# Patient Record
Sex: Male | Born: 1935
Health system: Southern US, Community
[De-identification: ages and names within clinical notes are randomized; demographics above are authoritative.]

## PROBLEM LIST (undated history)

## (undated) DIAGNOSIS — H9190 Unspecified hearing loss, unspecified ear: Secondary | ICD-10-CM

## (undated) DIAGNOSIS — I10 Essential (primary) hypertension: Secondary | ICD-10-CM

## (undated) DIAGNOSIS — E119 Type 2 diabetes mellitus without complications: Secondary | ICD-10-CM

## (undated) DIAGNOSIS — F0390 Unspecified dementia without behavioral disturbance: Secondary | ICD-10-CM

## (undated) DIAGNOSIS — Z9289 Personal history of other medical treatment: Secondary | ICD-10-CM

## (undated) DIAGNOSIS — E785 Hyperlipidemia, unspecified: Secondary | ICD-10-CM

## (undated) HISTORY — DX: Unspecified dementia, unspecified severity, without behavioral disturbance, psychotic disturbance, mood disturbance, and anxiety: F03.90

## (undated) HISTORY — DX: Personal history of other medical treatment: Z92.89

## (undated) HISTORY — DX: Hyperlipidemia, unspecified: E78.5

## (undated) HISTORY — DX: Essential (primary) hypertension: I10

## (undated) HISTORY — DX: Unspecified hearing loss, unspecified ear: H91.90

## (undated) HISTORY — DX: Type 2 diabetes mellitus without complications: E11.9

---

## 1993-07-07 HISTORY — PX: BACK SURGERY: SHX140

## 2002-01-03 ENCOUNTER — Encounter: Payer: Self-pay | Admitting: Neurosurgery

## 2002-01-03 ENCOUNTER — Encounter: Admission: RE | Admit: 2002-01-03 | Discharge: 2002-01-03 | Payer: Self-pay | Admitting: Neurosurgery

## 2002-01-17 ENCOUNTER — Encounter: Payer: Self-pay | Admitting: Neurosurgery

## 2002-01-17 ENCOUNTER — Encounter: Admission: RE | Admit: 2002-01-17 | Discharge: 2002-01-17 | Payer: Self-pay | Admitting: Neurosurgery

## 2003-12-06 ENCOUNTER — Ambulatory Visit (HOSPITAL_COMMUNITY): Admission: RE | Admit: 2003-12-06 | Discharge: 2003-12-06 | Payer: Self-pay | Admitting: *Deleted

## 2006-05-07 DIAGNOSIS — Z9289 Personal history of other medical treatment: Secondary | ICD-10-CM

## 2006-05-07 HISTORY — DX: Personal history of other medical treatment: Z92.89

## 2006-06-17 ENCOUNTER — Ambulatory Visit (HOSPITAL_COMMUNITY): Admission: RE | Admit: 2006-06-17 | Discharge: 2006-06-18 | Payer: Self-pay | Admitting: Specialist

## 2010-09-05 DIAGNOSIS — Z9289 Personal history of other medical treatment: Secondary | ICD-10-CM

## 2010-09-05 HISTORY — DX: Personal history of other medical treatment: Z92.89

## 2014-11-01 DIAGNOSIS — E118 Type 2 diabetes mellitus with unspecified complications: Secondary | ICD-10-CM | POA: Diagnosis not present

## 2014-11-01 DIAGNOSIS — I10 Essential (primary) hypertension: Secondary | ICD-10-CM | POA: Diagnosis not present

## 2014-11-01 DIAGNOSIS — E7889 Other lipoprotein metabolism disorders: Secondary | ICD-10-CM | POA: Diagnosis not present

## 2014-11-01 DIAGNOSIS — E119 Type 2 diabetes mellitus without complications: Secondary | ICD-10-CM | POA: Diagnosis not present

## 2014-11-08 DIAGNOSIS — I10 Essential (primary) hypertension: Secondary | ICD-10-CM | POA: Diagnosis not present

## 2014-11-08 DIAGNOSIS — E789 Disorder of lipoprotein metabolism, unspecified: Secondary | ICD-10-CM | POA: Diagnosis not present

## 2014-11-08 DIAGNOSIS — E118 Type 2 diabetes mellitus with unspecified complications: Secondary | ICD-10-CM | POA: Diagnosis not present

## 2015-01-23 ENCOUNTER — Encounter: Payer: Self-pay | Admitting: *Deleted

## 2015-03-02 ENCOUNTER — Encounter: Payer: Self-pay | Admitting: Cardiovascular Disease

## 2015-03-09 ENCOUNTER — Encounter: Payer: Self-pay | Admitting: Cardiology

## 2015-03-15 DIAGNOSIS — L309 Dermatitis, unspecified: Secondary | ICD-10-CM | POA: Diagnosis not present

## 2015-03-15 DIAGNOSIS — L02223 Furuncle of chest wall: Secondary | ICD-10-CM | POA: Diagnosis not present

## 2015-03-15 DIAGNOSIS — L720 Epidermal cyst: Secondary | ICD-10-CM | POA: Diagnosis not present

## 2015-04-11 ENCOUNTER — Other Ambulatory Visit: Payer: Self-pay | Admitting: Endocrinology

## 2015-04-11 DIAGNOSIS — R109 Unspecified abdominal pain: Secondary | ICD-10-CM | POA: Diagnosis not present

## 2015-04-11 DIAGNOSIS — E789 Disorder of lipoprotein metabolism, unspecified: Secondary | ICD-10-CM | POA: Diagnosis not present

## 2015-04-11 DIAGNOSIS — N390 Urinary tract infection, site not specified: Secondary | ICD-10-CM | POA: Diagnosis not present

## 2015-04-11 DIAGNOSIS — R1011 Right upper quadrant pain: Secondary | ICD-10-CM

## 2015-04-11 DIAGNOSIS — E118 Type 2 diabetes mellitus with unspecified complications: Secondary | ICD-10-CM | POA: Diagnosis not present

## 2015-04-12 ENCOUNTER — Emergency Department (HOSPITAL_COMMUNITY): Payer: Medicare Other

## 2015-04-12 ENCOUNTER — Other Ambulatory Visit (HOSPITAL_COMMUNITY): Payer: Self-pay | Admitting: Endocrinology

## 2015-04-12 ENCOUNTER — Inpatient Hospital Stay (HOSPITAL_COMMUNITY)
Admission: EM | Admit: 2015-04-12 | Discharge: 2015-04-16 | DRG: 418 | Disposition: A | Discharge status: Home / Self Care | Payer: Medicare Other | Attending: General Surgery | Admitting: General Surgery

## 2015-04-12 ENCOUNTER — Encounter (HOSPITAL_COMMUNITY): Payer: Self-pay | Admitting: Emergency Medicine

## 2015-04-12 ENCOUNTER — Ambulatory Visit (HOSPITAL_COMMUNITY)
Admission: RE | Admit: 2015-04-12 | Discharge: 2015-04-12 | Disposition: A | Discharge status: Home / Self Care | Payer: Medicare Other | Source: Ambulatory Visit | Attending: Endocrinology | Admitting: Endocrinology

## 2015-04-12 DIAGNOSIS — I4430 Unspecified atrioventricular block: Secondary | ICD-10-CM | POA: Diagnosis not present

## 2015-04-12 DIAGNOSIS — R110 Nausea: Secondary | ICD-10-CM | POA: Diagnosis not present

## 2015-04-12 DIAGNOSIS — K819 Cholecystitis, unspecified: Secondary | ICD-10-CM

## 2015-04-12 DIAGNOSIS — K8000 Calculus of gallbladder with acute cholecystitis without obstruction: Principal | ICD-10-CM | POA: Diagnosis present

## 2015-04-12 DIAGNOSIS — I10 Essential (primary) hypertension: Secondary | ICD-10-CM | POA: Diagnosis present

## 2015-04-12 DIAGNOSIS — R1084 Generalized abdominal pain: Secondary | ICD-10-CM | POA: Diagnosis not present

## 2015-04-12 DIAGNOSIS — J189 Pneumonia, unspecified organism: Secondary | ICD-10-CM | POA: Diagnosis not present

## 2015-04-12 DIAGNOSIS — R1011 Right upper quadrant pain: Secondary | ICD-10-CM

## 2015-04-12 DIAGNOSIS — I440 Atrioventricular block, first degree: Secondary | ICD-10-CM | POA: Diagnosis not present

## 2015-04-12 DIAGNOSIS — E785 Hyperlipidemia, unspecified: Secondary | ICD-10-CM | POA: Diagnosis not present

## 2015-04-12 DIAGNOSIS — N179 Acute kidney failure, unspecified: Secondary | ICD-10-CM | POA: Diagnosis not present

## 2015-04-12 DIAGNOSIS — H9190 Unspecified hearing loss, unspecified ear: Secondary | ICD-10-CM | POA: Diagnosis present

## 2015-04-12 DIAGNOSIS — E119 Type 2 diabetes mellitus without complications: Secondary | ICD-10-CM | POA: Diagnosis present

## 2015-04-12 DIAGNOSIS — Z794 Long term (current) use of insulin: Secondary | ICD-10-CM | POA: Diagnosis not present

## 2015-04-12 DIAGNOSIS — D649 Anemia, unspecified: Secondary | ICD-10-CM | POA: Diagnosis present

## 2015-04-12 DIAGNOSIS — R935 Abnormal findings on diagnostic imaging of other abdominal regions, including retroperitoneum: Secondary | ICD-10-CM | POA: Diagnosis not present

## 2015-04-12 DIAGNOSIS — Z01818 Encounter for other preprocedural examination: Secondary | ICD-10-CM | POA: Diagnosis not present

## 2015-04-12 DIAGNOSIS — E118 Type 2 diabetes mellitus with unspecified complications: Secondary | ICD-10-CM | POA: Diagnosis not present

## 2015-04-12 DIAGNOSIS — Z87891 Personal history of nicotine dependence: Secondary | ICD-10-CM | POA: Diagnosis not present

## 2015-04-12 DIAGNOSIS — R109 Unspecified abdominal pain: Secondary | ICD-10-CM | POA: Diagnosis not present

## 2015-04-12 DIAGNOSIS — I129 Hypertensive chronic kidney disease with stage 1 through stage 4 chronic kidney disease, or unspecified chronic kidney disease: Secondary | ICD-10-CM | POA: Diagnosis not present

## 2015-04-12 DIAGNOSIS — N189 Chronic kidney disease, unspecified: Secondary | ICD-10-CM | POA: Diagnosis not present

## 2015-04-12 DIAGNOSIS — R7989 Other specified abnormal findings of blood chemistry: Secondary | ICD-10-CM | POA: Diagnosis not present

## 2015-04-12 DIAGNOSIS — R319 Hematuria, unspecified: Secondary | ICD-10-CM | POA: Diagnosis not present

## 2015-04-12 DIAGNOSIS — K8020 Calculus of gallbladder without cholecystitis without obstruction: Secondary | ICD-10-CM | POA: Diagnosis not present

## 2015-04-12 DIAGNOSIS — K810 Acute cholecystitis: Secondary | ICD-10-CM | POA: Diagnosis present

## 2015-04-12 DIAGNOSIS — Z01810 Encounter for preprocedural cardiovascular examination: Secondary | ICD-10-CM | POA: Diagnosis not present

## 2015-04-12 LAB — CBC WITH DIFFERENTIAL/PLATELET
BASOS ABS: 0 10*3/uL (ref 0.0–0.1)
BASOS PCT: 0 %
EOS ABS: 0 10*3/uL (ref 0.0–0.7)
Eosinophils Relative: 0 %
HEMATOCRIT: 37.7 % — AB (ref 39.0–52.0)
HEMOGLOBIN: 12.4 g/dL — AB (ref 13.0–17.0)
Lymphocytes Relative: 8 %
Lymphs Abs: 1.3 10*3/uL (ref 0.7–4.0)
MCH: 29.9 pg (ref 26.0–34.0)
MCHC: 32.9 g/dL (ref 30.0–36.0)
MCV: 90.8 fL (ref 78.0–100.0)
Monocytes Absolute: 1.3 10*3/uL — ABNORMAL HIGH (ref 0.1–1.0)
Monocytes Relative: 8 %
NEUTROS ABS: 14.5 10*3/uL — AB (ref 1.7–7.7)
NEUTROS PCT: 84 %
Platelets: 220 10*3/uL (ref 150–400)
RBC: 4.15 MIL/uL — ABNORMAL LOW (ref 4.22–5.81)
RDW: 13.1 % (ref 11.5–15.5)
WBC: 17.2 10*3/uL — ABNORMAL HIGH (ref 4.0–10.5)

## 2015-04-12 LAB — POCT I-STAT CREATININE: CREATININE: 1.8 mg/dL — AB (ref 0.61–1.24)

## 2015-04-12 MED ORDER — PIPERACILLIN-TAZOBACTAM 3.375 G IVPB
3.3750 g | Freq: Once | INTRAVENOUS | Status: AC
  Administered 2015-04-13: 3.375 g via INTRAVENOUS
  Filled 2015-04-12: qty 50

## 2015-04-12 NOTE — ED Provider Notes (Signed)
MSE was initiated and I personally evaluated the patient and placed orders (if any) at  10:46 PM on April 12, 2015.  The patient appears stable so that the remainder of the MSE may be completed by another provider.  Abdominal pain since Monday. Abnormal outpatient CT concerning for cholecystitis. Labs ordered. CT reviewed. [place] ordered. Vitals stable  Azalia Bilis, MD 04/12/15 2251

## 2015-04-12 NOTE — ED Notes (Signed)
Bed: WA04 Expected date:  Expected time:  Means of arrival:  Comments: Triage 1 

## 2015-04-12 NOTE — ED Provider Notes (Signed)
CSN: [id number]     Arrival date & time 04/12/15  2117 History   First MD Initiated Contact with Patient 04/12/15 2241     Chief Complaint  Patient presents with  . Abdominal Pain     (Consider location/radiation/quality/duration/timing/severity/associated sxs/prior Treatment) HPI   Alexander Powell is a 81 y.o. male with PMH of HTN, HLD, DM, here for outpatient CT showing cholecystitis.  Patient has had abdominal pain and was seen by PCP.  CT scan ordered and patient was called in by radiology to come to the ED for evaluation.    10 Systems reviewed and are negative for acute change except as noted in the HPI.     Past Medical History  Diagnosis Date  . Hypertension   . Hyperlipidemia   . DM (diabetes mellitus), type 2 (HCC)   . Hearing loss     pt wear hearing aid  . Hx of echocardiogram 09/2010    Normal LV size with proximal septal thickening, but normal EF at greater than 55% with mild impaired relaxation. Mildly sclerotic, but not stenotic, aortic valve with aortic sclerosis noted, but no other significant lesions or abnormalities, just some left atrial calcification.  Marland Kitchen History of stress test 05/2006    Showed a mild to moderate perfusion defect in the apical wall with no reversibility. He also had a mild to moderate perfusion defect in the mid inferior wall with no reversibilty, and a mild to moderate perfusion defect in the apicl inferior wall with no reversibility. There is thought thatthis could be potentially due to infarct or scar, but not corroborated with his echo.    Past Surgical History  Procedure Laterality Date  . Back surgery  1995   History reviewed. No pertinent family history. Social History  Substance Use Topics  . Smoking status: Former Smoker    Types: Cigarettes    Quit date: 07/25/1988  . Smokeless tobacco: Never Used  . Alcohol Use: 0.0 oz/week    0 Standard drinks or equivalent per week     Comment: occassionally    Review of  Systems    Allergies  Review of patient's allergies indicates no known allergies.  Home Medications   Prior to Admission medications   Medication Sig Start Date End Date Taking? Authorizing Provider  aspirin 81 MG tablet Take 81 mg by mouth daily.   Yes Historical Provider, MD  cephALEXin (KEFLEX) 500 MG capsule Take 500 mg by mouth 4 (four) times daily.   Yes Historical Provider, MD  glipiZIDE (GLUCOTROL XL) 2.5 MG 24 hr tablet Take 2.5 mg by mouth daily with breakfast.   Yes Historical Provider, MD  metFORMIN (GLUCOPHAGE-XR) 500 MG 24 hr tablet Take 500 mg by mouth 2 (two) times daily.  04/11/15  Yes Historical Provider, MD  Multiple Vitamin (MULTIVITAMIN WITH MINERALS) TABS tablet Take 1 tablet by mouth daily.   Yes Historical Provider, MD  pravastatin (PRAVACHOL) 80 MG tablet Take 80 mg by mouth daily.  03/21/15  Yes Historical Provider, MD  ramipril (ALTACE) 5 MG capsule Take 5 mg by mouth daily.   Yes Historical Provider, MD   BP 140/39 mmHg  Pulse 83  Temp(Src) 99.6 F (37.6 C) (Oral)  Resp 17  SpO2 94% Physical Exam  Constitutional: He is oriented to person, place, and time. Vital signs are normal. He appears well-developed and well-nourished.  Non-toxic appearance. He does not appear ill. No distress.  HENT:  Head: Normocephalic and atraumatic.  Nose: Nose normal.  Mouth/Throat:  Oropharynx is clear and moist. No oropharyngeal exudate.  Eyes: Conjunctivae and EOM are normal. Pupils are equal, round, and reactive to light. No scleral icterus.  Neck: Normal range of motion. Neck supple. No tracheal deviation, no edema, no erythema and normal range of motion present. No thyroid mass and no thyromegaly present.  Cardiovascular: Normal rate, regular rhythm, S1 normal, S2 normal, normal heart sounds, intact distal pulses and normal pulses.  Exam reveals no gallop and no friction rub.   No murmur heard. Pulses:      Radial pulses are 2+ on the right side, and 2+ on the left side.        Dorsalis pedis pulses are 2+ on the right side, and 2+ on the left side.  Pulmonary/Chest: Effort normal and breath sounds normal. No respiratory distress. He has no wheezes. He has no rhonchi. He has no rales.  Abdominal: Soft. Normal appearance and bowel sounds are normal. He exhibits no distension, no ascites and no mass. There is no hepatosplenomegaly. There is no tenderness. There is no rebound, no guarding and no CVA tenderness.  Musculoskeletal: Normal range of motion. He exhibits no edema or tenderness.  Lymphadenopathy:    He has no cervical adenopathy.  Neurological: He is alert and oriented to person, place, and time. He has normal strength. No cranial nerve deficit or sensory deficit.  Skin: Skin is warm, dry and intact. No petechiae and no rash noted. He is not diaphoretic. No erythema. No pallor.  Psychiatric: He has a normal mood and affect. His behavior is normal. Judgment normal.  Nursing note and vitals reviewed.   ED Course  Procedures (including critical care time) Labs Review Labs Reviewed  CBC WITH DIFFERENTIAL/PLATELET - Abnormal; Notable for the following:    WBC 17.2 (*)    RBC 4.15 (*)    Hemoglobin 12.4 (*)    HCT 37.7 (*)    Neutro Abs 14.5 (*)    Monocytes Absolute 1.3 (*)    All other components within normal limits  COMPREHENSIVE METABOLIC PANEL - Abnormal; Notable for the following:    Glucose, Bld 260 (*)    BUN 38 (*)    Creatinine, Ser 1.77 (*)    AST 42 (*)    GFR calc non Af Amer 32 (*)    GFR calc Af Amer 37 (*)    All other components within normal limits  LIPASE, BLOOD - Abnormal; Notable for the following:    Lipase 17 (*)    All other components within normal limits    Imaging Review Ct Abdomen Pelvis Wo Contrast  04/12/2015   ADDENDUM REPORT: 04/12/2015 19:46 ADDENDUM: These results were called by telephone at the time of interpretation on 04/12/2015 at 7:35 pm to Dr. Renne Crigler, who verbally acknowledged these results. Electronically  Signed   By: Bary Richard M.D.   On: 04/12/2015 19:46  04/12/2015   CLINICAL DATA:  Right-sided abdominal pain for 3 days, nausea. History of diabetes and hypertension.  EXAM: CT ABDOMEN AND PELVIS WITHOUT CONTRAST  TECHNIQUE: Multidetector CT imaging of the abdomen and pelvis was performed following the standard protocol without IV contrast.  COMPARISON:  None.  FINDINGS: There are multiple stones within the gallbladder, including a stone (or collection of stones) in the region of the gallbladder neck/ cystic duct. Gallbladder is distended and there is gallbladder wall thickening and pericholecystic edema highly suggestive of an acute cholecystitis.  Liver, spleen, pancreas, and adrenal glands appear normal. A mixture of hypodense  and hyperdense masses are seen throughout both kidneys, difficult to definitively characterize without IV contrast, but most suggestive of a combination of simple and hemorrhagic cysts. No renal stone or hydronephrosis bilaterally.  Bowel is normal in caliber. Scattered diverticulosis noted within the descending and sigmoid colon without evidence of acute diverticulitis. Appendix is normal. No bowel wall thickening or evidence of bowel wall inflammation seen. Prostate gland is moderately enlarged causing slight mass effect on the bladder base.  No focal fluid collection or abscess. No free intraperitoneal air. Atherosclerotic changes are seen throughout the normal- caliber abdominal aorta.  Degenerative changes are seen throughout the thoracolumbar spine but no acute osseous abnormality. Ill-defined consolidations at each lung base are probably atelectasis or confluent scarring.  IMPRESSION: 1. Findings highly suggestive of acute cholecystitis with gallbladder distension, gallbladder wall thickening, and pericholecystic edema. At least 2 stones, largest measuring approximately 1 cm greatest dimension, likely impacted within the gallbladder neck or cystic duct. Multiple additional  gallstones in the gallbladder fundus region. 2. Probable simple cysts and hemorrhagic cysts within each kidney. Would consider follow-up renal ultrasound or contrast-enhanced renal CT at some point for confirmation of benignity. 3. Colonic diverticulosis without evidence of acute diverticulitis. 4. Consolidations at each lung base, right greater than left, most likely atelectasis or chronic scarring. Pneumonia felt to be less likely.  Electronically Signed: By: Bary Richard M.D. On: 04/12/2015 19:33   US Abdomen Limited Ruq  04/13/2015   CLINICAL DATA:  Right-sided abdominal pain and nausea, 3 days duration.  EXAM: US ABDOMEN LIMITED - RIGHT UPPER QUADRANT  COMPARISON:  CT 04/12/2015  FINDINGS: Gallbladder:  The gallbladder wall is thickened and edematous, 7 mm. There are gallstones measuring up to 14 mm. The patient did not appear to be tender over the gallbladder.  Common bile duct:  Diameter: 4.0 mm, normal.  Liver:  No focal lesion identified. Within normal limits in parenchymal echogenicity.  IMPRESSION: Gallbladder mural thickening and edema. Cholelithiasis. The findings could represent acute cholecystitis.   Electronically Signed   By: Ellery Plunk M.D.   On: 04/13/2015 00:17   I have personally reviewed and evaluated these images and lab results as part of my medical decision-making.   EKG Interpretation None      MDM   Final diagnoses:  None    Patient presents to the ED for CT scan findings showing cholecystitis.  Will obtains labs and RUQ [place] for evaluation.  Will page surgery for admission and recommendations.  WBC is 17,  LFTs are not remarkably high.  Zosyn ordered by previous provider.  I spoke with Dr. Gerrit Friends who will evaluate the patient in the ED.  Patient will require admission either to surgery or medicine.  He is currently HD stable.   Tomasita Crumble, MD 04/13/15 [phone]

## 2015-04-12 NOTE — ED Notes (Signed)
Patient presents with family members because unknown physician called patient's wife and told them to return to emergency department.  Pt has had abdominal pain and currently has pain rated at 4 out of 10.  Wife states that he was prescribed medications for infection but does not know what kind of infection or where the infection is located.

## 2015-04-13 ENCOUNTER — Encounter (HOSPITAL_COMMUNITY): Payer: Self-pay | Admitting: Surgery

## 2015-04-13 ENCOUNTER — Encounter (HOSPITAL_COMMUNITY): Admission: EM | Disposition: A | Discharge status: Home / Self Care | Payer: Self-pay | Source: Home / Self Care

## 2015-04-13 ENCOUNTER — Other Ambulatory Visit: Payer: Self-pay

## 2015-04-13 ENCOUNTER — Observation Stay (HOSPITAL_COMMUNITY): Payer: Medicare Other

## 2015-04-13 ENCOUNTER — Observation Stay (HOSPITAL_COMMUNITY): Payer: Medicare Other | Admitting: Registered Nurse

## 2015-04-13 DIAGNOSIS — I4430 Unspecified atrioventricular block: Secondary | ICD-10-CM

## 2015-04-13 DIAGNOSIS — K8020 Calculus of gallbladder without cholecystitis without obstruction: Secondary | ICD-10-CM | POA: Diagnosis not present

## 2015-04-13 DIAGNOSIS — Z01810 Encounter for preprocedural cardiovascular examination: Secondary | ICD-10-CM | POA: Diagnosis not present

## 2015-04-13 DIAGNOSIS — K8000 Calculus of gallbladder with acute cholecystitis without obstruction: Secondary | ICD-10-CM | POA: Diagnosis present

## 2015-04-13 DIAGNOSIS — R1084 Generalized abdominal pain: Secondary | ICD-10-CM | POA: Diagnosis not present

## 2015-04-13 DIAGNOSIS — Z01818 Encounter for other preprocedural examination: Secondary | ICD-10-CM | POA: Diagnosis not present

## 2015-04-13 DIAGNOSIS — E119 Type 2 diabetes mellitus without complications: Secondary | ICD-10-CM | POA: Diagnosis not present

## 2015-04-13 LAB — GLUCOSE, CAPILLARY
GLUCOSE-CAPILLARY: 249 mg/dL — AB (ref 65–99)
GLUCOSE-CAPILLARY: 271 mg/dL — AB (ref 65–99)
Glucose-Capillary: 190 mg/dL — ABNORMAL HIGH (ref 65–99)
Glucose-Capillary: 203 mg/dL — ABNORMAL HIGH (ref 65–99)

## 2015-04-13 LAB — SURGICAL PCR SCREEN
MRSA, PCR: NEGATIVE
Staphylococcus aureus: NEGATIVE

## 2015-04-13 LAB — COMPREHENSIVE METABOLIC PANEL
ALT: 28 U/L (ref 17–63)
AST: 42 U/L — AB (ref 15–41)
Albumin: 3.5 g/dL (ref 3.5–5.0)
Alkaline Phosphatase: 77 U/L (ref 38–126)
Anion gap: 9 (ref 5–15)
BUN: 38 mg/dL — AB (ref 6–20)
CHLORIDE: 102 mmol/L (ref 101–111)
CO2: 24 mmol/L (ref 22–32)
Calcium: 9 mg/dL (ref 8.9–10.3)
Creatinine, Ser: 1.77 mg/dL — ABNORMAL HIGH (ref 0.61–1.24)
GFR calc Af Amer: 37 mL/min — ABNORMAL LOW (ref >60)
GFR, EST NON AFRICAN AMERICAN: 32 mL/min — AB (ref >60)
Glucose, Bld: 260 mg/dL — ABNORMAL HIGH (ref 65–99)
Potassium: 4.4 mmol/L (ref 3.5–5.1)
Sodium: 135 mmol/L (ref 135–145)
Total Bilirubin: 0.6 mg/dL (ref 0.3–1.2)
Total Protein: 6.9 g/dL (ref 6.5–8.1)

## 2015-04-13 LAB — LIPASE, BLOOD: LIPASE: 17 U/L — AB (ref 22–51)

## 2015-04-13 SURGERY — LAPAROSCOPIC CHOLECYSTECTOMY WITH INTRAOPERATIVE CHOLANGIOGRAM
Anesthesia: General

## 2015-04-13 MED ORDER — ONDANSETRON HCL 4 MG/2ML IJ SOLN: 4.0000 mg | Freq: Four times a day (QID) | INTRAMUSCULAR | Status: DC | PRN

## 2015-04-13 MED ORDER — FENTANYL CITRATE (PF) 250 MCG/5ML IJ SOLN
INTRAMUSCULAR | Status: AC
  Filled 2015-04-13: qty 25

## 2015-04-13 MED ORDER — ONDANSETRON HCL 4 MG/2ML IJ SOLN
INTRAMUSCULAR | Status: AC
  Filled 2015-04-13: qty 2

## 2015-04-13 MED ORDER — CHLORHEXIDINE GLUCONATE 0.12 % MT SOLN
15.0000 mL | Freq: Two times a day (BID) | OROMUCOSAL | Status: DC
  Administered 2015-04-13 – 2015-04-16 (×4): 15 mL via OROMUCOSAL
  Filled 2015-04-13 (×7): qty 15

## 2015-04-13 MED ORDER — ACETAMINOPHEN 325 MG PO TABS
650.0000 mg | ORAL_TABLET | Freq: Four times a day (QID) | ORAL | Status: DC | PRN
  Administered 2015-04-13: 650 mg via ORAL
  Filled 2015-04-13: qty 2

## 2015-04-13 MED ORDER — HYDROCODONE-ACETAMINOPHEN 5-325 MG PO TABS: 1.0000 | ORAL_TABLET | ORAL | Status: DC | PRN

## 2015-04-13 MED ORDER — ACETAMINOPHEN 325 MG PO TABS
650.0000 mg | ORAL_TABLET | Freq: Once | ORAL | Status: DC
  Filled 2015-04-13: qty 2

## 2015-04-13 MED ORDER — SODIUM CHLORIDE 0.9 % IV SOLN
Freq: Once | INTRAVENOUS | Status: AC
  Administered 2015-04-13: 04:00:00 via INTRAVENOUS

## 2015-04-13 MED ORDER — PIPERACILLIN-TAZOBACTAM 3.375 G IVPB
3.3750 g | Freq: Three times a day (TID) | INTRAVENOUS | Status: DC
  Administered 2015-04-13 – 2015-04-16 (×10): 3.375 g via INTRAVENOUS
  Filled 2015-04-13 (×12): qty 50

## 2015-04-13 MED ORDER — PROPOFOL 10 MG/ML IV BOLUS
INTRAVENOUS | Status: AC
  Filled 2015-04-13: qty 20

## 2015-04-13 MED ORDER — KCL IN DEXTROSE-NACL 20-5-0.45 MEQ/L-%-% IV SOLN
INTRAVENOUS | Status: DC
  Administered 2015-04-13 – 2015-04-15 (×4): via INTRAVENOUS
  Filled 2015-04-13 (×6): qty 1000

## 2015-04-13 MED ORDER — HYDROMORPHONE HCL 1 MG/ML IJ SOLN: 1.0000 mg | INTRAMUSCULAR | Status: DC | PRN

## 2015-04-13 MED ORDER — ROCURONIUM BROMIDE 100 MG/10ML IV SOLN
INTRAVENOUS | Status: AC
  Filled 2015-04-13: qty 1

## 2015-04-13 MED ORDER — ONDANSETRON 4 MG PO TBDP: 4.0000 mg | ORAL_TABLET | Freq: Four times a day (QID) | ORAL | Status: DC | PRN

## 2015-04-13 MED ORDER — ACETAMINOPHEN 325 MG PO TABS
650.0000 mg | ORAL_TABLET | Freq: Once | ORAL | Status: AC
  Administered 2015-04-13: 650 mg via ORAL

## 2015-04-13 MED ORDER — ACETAMINOPHEN 650 MG RE SUPP: 650.0000 mg | Freq: Four times a day (QID) | RECTAL | Status: DC | PRN

## 2015-04-13 MED ORDER — INSULIN ASPART 100 UNIT/ML ~~LOC~~ SOLN
0.0000 [IU] | SUBCUTANEOUS | Status: DC
  Administered 2015-04-13: 5 [IU] via SUBCUTANEOUS
  Administered 2015-04-13: 3 [IU] via SUBCUTANEOUS
  Administered 2015-04-13: 5 [IU] via SUBCUTANEOUS
  Administered 2015-04-13 – 2015-04-14 (×2): 8 [IU] via SUBCUTANEOUS
  Administered 2015-04-14: 5 [IU] via SUBCUTANEOUS
  Administered 2015-04-14: 2 [IU] via SUBCUTANEOUS
  Administered 2015-04-14: 8 [IU] via SUBCUTANEOUS
  Administered 2015-04-15: 15 [IU] via SUBCUTANEOUS
  Administered 2015-04-15: 5 [IU] via SUBCUTANEOUS
  Administered 2015-04-15: 3 [IU] via SUBCUTANEOUS
  Administered 2015-04-15: 5 [IU] via SUBCUTANEOUS
  Administered 2015-04-15: 11 [IU] via SUBCUTANEOUS
  Administered 2015-04-16: 3 [IU] via SUBCUTANEOUS
  Administered 2015-04-16 (×2): 2 [IU] via SUBCUTANEOUS

## 2015-04-13 MED ORDER — CETYLPYRIDINIUM CHLORIDE 0.05 % MT LIQD
7.0000 mL | Freq: Two times a day (BID) | OROMUCOSAL | Status: DC
  Administered 2015-04-14 – 2015-04-16 (×5): 7 mL via OROMUCOSAL

## 2015-04-13 MED ORDER — LIDOCAINE HCL (CARDIAC) 20 MG/ML IV SOLN
INTRAVENOUS | Status: AC
  Filled 2015-04-13: qty 5

## 2015-04-13 MED ORDER — FENTANYL CITRATE (PF) 100 MCG/2ML IJ SOLN: 25.0000 ug | INTRAMUSCULAR | Status: DC | PRN

## 2015-04-13 NOTE — Progress Notes (Signed)
Patient ID: Alexander Powell, male   DOB: [birth year], 81 y.o.   MRN: [id number] Day of Surgery  Subjective: Pt feels well this morning with no pain.    Objective: Vital signs in last 24 hours: Temp:  [97.8 F (36.6 C)-101.3 F (38.5 C)] 98 F (36.7 C) (10/07 0900) Pulse Rate:  [44-86] 60 (10/07 0900) Resp:  [16-29] 18 (10/07 0900) BP: (113-140)/(39-69) 116/69 mmHg (10/07 0900) SpO2:  [92 %-99 %] 95 % (10/07 0900)    Intake/Output from previous day:   Intake/Output this shift:    PE: Abd: soft, really not tender right now, +BS, ND Heart: regular, mild brady Lungs: CTAB  Lab Results:   Recent Labs  04/12/15 2328  WBC 17.2*  HGB 12.4*  HCT 37.7*  PLT 220   BMET  Recent Labs  04/12/15 1839 04/12/15 2328  NA  --  135  K  --  4.4  CL  --  102  CO2  --  24  GLUCOSE  --  260*  BUN  --  38*  CREATININE 1.80* 1.77*  CALCIUM  --  9.0   PT/INR No results for input(s): LABPROT, INR in the last 72 hours. CMP     Component Value Date/Time   NA 135 04/12/2015 2328   K 4.4 04/12/2015 2328   CL 102 04/12/2015 2328   CO2 24 04/12/2015 2328   GLUCOSE 260* 04/12/2015 2328   BUN 38* 04/12/2015 2328   CREATININE 1.77* 04/12/2015 2328   CALCIUM 9.0 04/12/2015 2328   PROT 6.9 04/12/2015 2328   ALBUMIN 3.5 04/12/2015 2328   AST 42* 04/12/2015 2328   ALT 28 04/12/2015 2328   ALKPHOS 77 04/12/2015 2328   BILITOT 0.6 04/12/2015 2328   GFRNONAA 32* 04/12/2015 2328   GFRAA 37* 04/12/2015 2328   Lipase     Component Value Date/Time   LIPASE 17* 04/12/2015 2328       Studies/Results: Ct Abdomen Pelvis Wo Contrast  04/12/2015   ADDENDUM REPORT: 04/12/2015 19:46 ADDENDUM: These results were called by telephone at the time of interpretation on 04/12/2015 at 7:35 pm to Dr. Renne Crigler, who verbally acknowledged these results. Electronically Signed   By: Bary Richard M.D.   On: 04/12/2015 19:46  04/12/2015   CLINICAL DATA:  Right-sided abdominal pain for 3 days, nausea. History of  diabetes and hypertension.  EXAM: CT ABDOMEN AND PELVIS WITHOUT CONTRAST  TECHNIQUE: Multidetector CT imaging of the abdomen and pelvis was performed following the standard protocol without IV contrast.  COMPARISON:  None.  FINDINGS: There are multiple stones within the gallbladder, including a stone (or collection of stones) in the region of the gallbladder neck/ cystic duct. Gallbladder is distended and there is gallbladder wall thickening and pericholecystic edema highly suggestive of an acute cholecystitis.  Liver, spleen, pancreas, and adrenal glands appear normal. A mixture of hypodense and hyperdense masses are seen throughout both kidneys, difficult to definitively characterize without IV contrast, but most suggestive of a combination of simple and hemorrhagic cysts. No renal stone or hydronephrosis bilaterally.  Bowel is normal in caliber. Scattered diverticulosis noted within the descending and sigmoid colon without evidence of acute diverticulitis. Appendix is normal. No bowel wall thickening or evidence of bowel wall inflammation seen. Prostate gland is moderately enlarged causing slight mass effect on the bladder base.  No focal fluid collection or abscess. No free intraperitoneal air. Atherosclerotic changes are seen throughout the normal- caliber abdominal aorta.  Degenerative changes are seen throughout the thoracolumbar spine  but no acute osseous abnormality. Ill-defined consolidations at each lung base are probably atelectasis or confluent scarring.  IMPRESSION: 1. Findings highly suggestive of acute cholecystitis with gallbladder distension, gallbladder wall thickening, and pericholecystic edema. At least 2 stones, largest measuring approximately 1 cm greatest dimension, likely impacted within the gallbladder neck or cystic duct. Multiple additional gallstones in the gallbladder fundus region. 2. Probable simple cysts and hemorrhagic cysts within each kidney. Would consider follow-up renal  ultrasound or contrast-enhanced renal CT at some point for confirmation of benignity. 3. Colonic diverticulosis without evidence of acute diverticulitis. 4. Consolidations at each lung base, right greater than left, most likely atelectasis or chronic scarring. Pneumonia felt to be less likely.  Electronically Signed: By: Bary Richard M.D. On: 04/12/2015 19:33   US Abdomen Limited Ruq  04/13/2015   CLINICAL DATA:  Right-sided abdominal pain and nausea, 3 days duration.  EXAM: US ABDOMEN LIMITED - RIGHT UPPER QUADRANT  COMPARISON:  CT 04/12/2015  FINDINGS: Gallbladder:  The gallbladder wall is thickened and edematous, 7 mm. There are gallstones measuring up to 14 mm. The patient did not appear to be tender over the gallbladder.  Common bile duct:  Diameter: 4.0 mm, normal.  Liver:  No focal lesion identified. Within normal limits in parenchymal echogenicity.  IMPRESSION: Gallbladder mural thickening and edema. Cholelithiasis. The findings could represent acute cholecystitis.   Electronically Signed   By: Ellery Plunk M.D.   On: 04/13/2015 00:17    Anti-infectives: Anti-infectives    Start     Dose/Rate Route Frequency Ordered Stop   04/13/15 0630  piperacillin-tazobactam (ZOSYN) IVPB 3.375 g     3.375 g 12.5 mL/hr over 240 Minutes Intravenous 3 times per day 04/13/15 0625     04/12/15 2300  piperacillin-tazobactam (ZOSYN) IVPB 3.375 g     3.375 g 12.5 mL/hr over 240 Minutes Intravenous  Once 04/12/15 2245 04/13/15 0411       Assessment/Plan  1. Acute cholecystitis -plan for OR today for lap chole -pre op EKG, but denies any cardiopulmonary symptoms -pre op PCXR as well -cont SSI for DM -recheck labs in am to follow WBC and Cr      ADDENDUM 1118am: patient's preop EKG shows sinus brady with 1st degree AV block.  Unknown history of this.  He has an EKG from 03/2015 that shows NSR, based off reading, unsure if he had some block at this time as well, but there are no other notes from  cardiology so not sure that he has ever had this before or been seen by cardiology.  We would be concerned about induction and distention of abdomen during surgery with brady and block.  Will have cards come see him today to evaluate this and determine whether he is safe for general anesthesia or if he may need a perc chole drain.  Tykera Skates E 04/13/2015, 9:30 AM Pager: [phone]

## 2015-04-13 NOTE — H&P (Signed)
Alexander Powell is an 81 y.o. male.    General Surgery [facility]  Chief Complaint: abdominal pain, acute cholecystitis, cholelithiasis  HPI: patient is a 81 yo BM with 4 day hx of abd pain in the upper abdomen.  Denies fever or chills.  Mild nausea, no emesis.  Referred to [facility] ER for evaluation by primary MD, Dr. Juleen China.  CT scan with thickened gallbladder wall, USN with cholelithiasis.  WBC elevated at 17K.  IV abx's started in ER and surgery asked to evaluate for cholecystectomy.  No prior abdominal surgery per wife.  HTN, but no acute cardiac issues.  Denies jaundice or acholic stools.  Past Medical History  Diagnosis Date  . Hypertension   . Hyperlipidemia   . DM (diabetes mellitus), type 2 (HCC)   . Hearing loss     pt wear hearing aid  . Hx of echocardiogram 09/2010    Normal LV size with proximal septal thickening, but normal EF at greater than 55% with mild impaired relaxation. Mildly sclerotic, but not stenotic, aortic valve with aortic sclerosis noted, but no other significant lesions or abnormalities, just some left atrial calcification.  Marland Kitchen History of stress test 05/2006    Showed a mild to moderate perfusion defect in the apical wall with no reversibility. He also had a mild to moderate perfusion defect in the mid inferior wall with no reversibilty, and a mild to moderate perfusion defect in the apicl inferior wall with no reversibility. There is thought thatthis could be potentially due to infarct or scar, but not corroborated with his echo.     Past Surgical History  Procedure Laterality Date  . Back surgery  1995    History reviewed. No pertinent family history. Social History:  reports that he quit smoking about 26 years ago. His smoking use included Cigarettes. He has never used smokeless tobacco. He reports that he drinks alcohol. He reports that he does not use illicit drugs.  Allergies: No Known Allergies   (Not in a hospital  admission)  Results for orders placed or performed during the hospital encounter of 04/12/15 (from the past 48 hour(s))  CBC with Differential/Platelet     Status: Abnormal   Collection Time: 04/12/15 11:28 PM  Result Value Ref Range   WBC 17.2 (H) 4.0 - 10.5 K/uL   RBC 4.15 (L) 4.22 - 5.81 MIL/uL   Hemoglobin 12.4 (L) 13.0 - 17.0 g/dL   HCT 81.4 (L) 04.5 - 39.6 %   MCV 90.8 78.0 - 100.0 fL   MCH 29.9 26.0 - 34.0 pg   MCHC 32.9 30.0 - 36.0 g/dL   RDW 83.4 99.8 - 83.1 %   Platelets 220 150 - 400 K/uL   Neutrophils Relative % 84 %   Neutro Abs 14.5 (H) 1.7 - 7.7 K/uL   Lymphocytes Relative 8 %   Lymphs Abs 1.3 0.7 - 4.0 K/uL   Monocytes Relative 8 %   Monocytes Absolute 1.3 (H) 0.1 - 1.0 K/uL   Eosinophils Relative 0 %   Eosinophils Absolute 0.0 0.0 - 0.7 K/uL   Basophils Relative 0 %   Basophils Absolute 0.0 0.0 - 0.1 K/uL  Comprehensive metabolic panel     Status: Abnormal   Collection Time: 04/12/15 11:28 PM  Result Value Ref Range   Sodium 135 135 - 145 mmol/L   Potassium 4.4 3.5 - 5.1 mmol/L   Chloride 102 101 - 111 mmol/L   CO2 24 22 - 32 mmol/L  Glucose, Bld 260 (H) 65 - 99 mg/dL   BUN 38 (H) 6 - 20 mg/dL   Creatinine, Ser 1.77 (H) 0.61 - 1.24 mg/dL   Calcium 9.0 8.9 - 10.3 mg/dL   Total Protein 6.9 6.5 - 8.1 g/dL   Albumin 3.5 3.5 - 5.0 g/dL   AST 42 (H) 15 - 41 U/L   ALT 28 17 - 63 U/L   Alkaline Phosphatase 77 38 - 126 U/L   Total Bilirubin 0.6 0.3 - 1.2 mg/dL   GFR calc non Af Amer 32 (L) >60 mL/min   GFR calc Af Amer 37 (L) >60 mL/min    Comment: (NOTE) The eGFR has been calculated using the CKD EPI equation. This calculation has not been validated in all clinical situations. eGFR's persistently <60 mL/min signify possible Chronic Kidney Disease.    Anion gap 9 5 - 15  Lipase, blood     Status: Abnormal   Collection Time: 04/12/15 11:28 PM  Result Value Ref Range   Lipase 17 (L) 22 - 51 U/L   Ct Abdomen Pelvis Wo Contrast  04/12/2015   ADDENDUM  REPORT: 04/12/2015 19:46 ADDENDUM: These results were called by telephone at the time of interpretation on 04/12/2015 at 7:35 pm to Dr. Shelia Media, who verbally acknowledged these results. Electronically Signed   By: Franki Cabot M.D.   On: 04/12/2015 19:46  04/12/2015   CLINICAL DATA:  Right-sided abdominal pain for 3 days, nausea. History of diabetes and hypertension.  EXAM: CT ABDOMEN AND PELVIS WITHOUT CONTRAST  TECHNIQUE: Multidetector CT imaging of the abdomen and pelvis was performed following the standard protocol without IV contrast.  COMPARISON:  None.  FINDINGS: There are multiple stones within the gallbladder, including a stone (or collection of stones) in the region of the gallbladder neck/ cystic duct. Gallbladder is distended and there is gallbladder wall thickening and pericholecystic edema highly suggestive of an acute cholecystitis.  Liver, spleen, pancreas, and adrenal glands appear normal. A mixture of hypodense and hyperdense masses are seen throughout both kidneys, difficult to definitively characterize without IV contrast, but most suggestive of a combination of simple and hemorrhagic cysts. No renal stone or hydronephrosis bilaterally.  Bowel is normal in caliber. Scattered diverticulosis noted within the descending and sigmoid colon without evidence of acute diverticulitis. Appendix is normal. No bowel wall thickening or evidence of bowel wall inflammation seen. Prostate gland is moderately enlarged causing slight mass effect on the bladder base.  No focal fluid collection or abscess. No free intraperitoneal air. Atherosclerotic changes are seen throughout the normal- caliber abdominal aorta.  Degenerative changes are seen throughout the thoracolumbar spine but no acute osseous abnormality. Ill-defined consolidations at each lung base are probably atelectasis or confluent scarring.  IMPRESSION: 1. Findings highly suggestive of acute cholecystitis with gallbladder distension, gallbladder wall  thickening, and pericholecystic edema. At least 2 stones, largest measuring approximately 1 cm greatest dimension, likely impacted within the gallbladder neck or cystic duct. Multiple additional gallstones in the gallbladder fundus region. 2. Probable simple cysts and hemorrhagic cysts within each kidney. Would consider follow-up renal ultrasound or contrast-enhanced renal CT at some point for confirmation of benignity. 3. Colonic diverticulosis without evidence of acute diverticulitis. 4. Consolidations at each lung base, right greater than left, most likely atelectasis or chronic scarring. Pneumonia felt to be less likely.  Electronically Signed: By: Franki Cabot M.D. On: 04/12/2015 19:33   US Abdomen Limited Ruq  04/13/2015   CLINICAL DATA:  Right-sided abdominal pain and nausea,  3 days duration.  EXAM: US ABDOMEN LIMITED - RIGHT UPPER QUADRANT  COMPARISON:  CT 04/12/2015  FINDINGS: Gallbladder:  The gallbladder wall is thickened and edematous, 7 mm. There are gallstones measuring up to 14 mm. The patient did not appear to be tender over the gallbladder.  Common bile duct:  Diameter: 4.0 mm, normal.  Liver:  No focal lesion identified. Within normal limits in parenchymal echogenicity.  IMPRESSION: Gallbladder mural thickening and edema. Cholelithiasis. The findings could represent acute cholecystitis.   Electronically Signed   By: Andreas Newport M.D.   On: 04/13/2015 00:17    Review of Systems  Constitutional: Positive for malaise/fatigue. Negative for fever and chills.  HENT: Negative.   Eyes: Negative.   Respiratory: Negative.   Cardiovascular: Negative.   Gastrointestinal: Positive for nausea and abdominal pain. Negative for vomiting.  Genitourinary: Negative.   Musculoskeletal: Negative.   Skin: Negative.   Neurological: Negative.   Endo/Heme/Allergies: Negative.   Psychiatric/Behavioral: Negative.     Blood pressure 116/52, pulse 61, temperature 101.3 F (38.5 C), temperature source  Rectal, resp. rate 21, SpO2 99 %. Physical Exam  Constitutional: He appears well-developed and well-nourished. No distress.  HENT:  Head: Normocephalic and atraumatic.  Right Ear: External ear normal.  Left Ear: External ear normal.  Mouth/Throat: No oropharyngeal exudate.  Eyes: Conjunctivae are normal. Pupils are equal, round, and reactive to light. No scleral icterus.  Neck: Normal range of motion. Neck supple. No tracheal deviation present. No thyromegaly present.  Cardiovascular: Normal rate and normal heart sounds.   No murmur heard. Respiratory: Effort normal and breath sounds normal. No respiratory distress. He has no wheezes.  GI: Soft. Bowel sounds are normal. He exhibits no distension and no mass. There is no tenderness. There is no guarding.  Musculoskeletal: Normal range of motion. He exhibits no edema.  Neurological: He is alert.  Skin: Skin is warm and dry. He is not diaphoretic.  Psychiatric: He has a normal mood and affect. His behavior is normal.     Assessment/Plan Acute cholecystitis, cholelithiasis  Admit to general surgery service  Anticipate cholecystectomy this admission  Continue IV Zosyn started in [place], MD, [facility] Office: [place] 04/13/2015, 6:16 AM

## 2015-04-13 NOTE — Consult Note (Signed)
CARDIOLOGY CONSULT NOTE   Patient ID: Alexander Powell MRN: [id number] DOB/AGE: [birth year] 81 y.o.  Admit date: 04/12/2015  Primary Physician   No primary care provider on file. Primary Cardiologist   New Reason for Consultation  Preop clearance  HPI: Alexander Powell is a 81 y.o. male with a history of HTN, HL, DM who presented 04/13/15 with abdominal pain.   Hx obtained by reviewing chart. No family member at bed site. Never seen by any cardiologist per patient. However reviewing chart seems like he was previously seen @ [facility]. Myoview in 2007 showed "Showed a mild to moderate perfusion defect in the apical wall with no reversibility. He also had a mild to moderate perfusion defect in the mid inferior wall with no reversibilty, and a mild to moderate perfusion defect in the apicl inferior wall with no reversibility. There is thought thatthis could be potentially due to infarct or scar, but not corroborated with his echo". Echo 09/2010 showed LV Ef of more than 55%.   He presented with abdominal pain x 4 days. CT scan with thickened gallbladder wall, USN with cholelithiasis.WBC elevated at 17K. IV abx's started in ER. Plan for laparoscopic cholecystectomy. Preop EKG shows sinus brady with 1st degree AV block and cardiology is consulted for further evaluation.  He denies exertional CP or SOB. He denies LE edema, orthopnea, PND, syncope, dizziness, melena.   Past Medical History  Diagnosis Date  . Hypertension   . Hyperlipidemia   . DM (diabetes mellitus), type 2 (HCC)   . Hearing loss     pt wear hearing aid  . Hx of echocardiogram 09/2010    Normal LV size with proximal septal thickening, but normal EF at greater than 55% with mild impaired relaxation. Mildly sclerotic, but not stenotic, aortic valve with aortic sclerosis noted, but no other significant lesions or abnormalities, just some left atrial calcification.  Marland Kitchen History of stress test 05/2006    Showed a mild to  moderate perfusion defect in the apical wall with no reversibility. He also had a mild to moderate perfusion defect in the mid inferior wall with no reversibilty, and a mild to moderate perfusion defect in the apicl inferior wall with no reversibility. There is thought thatthis could be potentially due to infarct or scar, but not corroborated with his echo.      Past Surgical History  Procedure Laterality Date  . Back surgery  1995    No Known Allergies  I have reviewed the patient's current medications . insulin aspart  0-15 Units Subcutaneous 6 times per day  . piperacillin-tazobactam (ZOSYN)  IV  3.375 g Intravenous 3 times per day   . dextrose 5 % and 0.45 % NaCl with KCl 20 mEq/L 100 mL/hr at 04/13/15 1103   acetaminophen **OR** acetaminophen, fentaNYL (SUBLIMAZE) injection, HYDROcodone-acetaminophen, HYDROmorphone (DILAUDID) injection, ondansetron **OR** ondansetron (ZOFRAN) IV  Prior to Admission medications   Medication Sig Start Date End Date Taking? Authorizing Provider  aspirin 81 MG tablet Take 81 mg by mouth daily.   Yes Historical Provider, MD  cephALEXin (KEFLEX) 500 MG capsule Take 500 mg by mouth 4 (four) times daily.   Yes Historical Provider, MD  glipiZIDE (GLUCOTROL XL) 2.5 MG 24 hr tablet Take 2.5 mg by mouth daily with breakfast.   Yes Historical Provider, MD  metFORMIN (GLUCOPHAGE-XR) 500 MG 24 hr tablet Take 500 mg by mouth 2 (two) times daily.  04/11/15  Yes Historical Provider, MD  Multiple Vitamin (MULTIVITAMIN WITH  MINERALS) TABS tablet Take 1 tablet by mouth daily.   Yes Historical Provider, MD  pravastatin (PRAVACHOL) 80 MG tablet Take 80 mg by mouth daily.  03/21/15  Yes Historical Provider, MD  ramipril (ALTACE) 5 MG capsule Take 5 mg by mouth daily.   Yes Historical Provider, MD     Social History   Social History  . Marital Status: Widowed    Spouse Name: N/A  . Number of Children: N/A  . Years of Education: N/A   Occupational History  . Not on  file.   Social History Main Topics  . Smoking status: Former Smoker    Types: Cigarettes    Quit date: 07/25/1988  . Smokeless tobacco: Never Used  . Alcohol Use: 0.0 oz/week    0 Standard drinks or equivalent per week     Comment: occassionally  . Drug Use: No  . Sexual Activity: No   Other Topics Concern  . Not on file   Social History Narrative    Family Status  Relation Status Death Age  . Mother Deceased   . Father Deceased   . Maternal Grandmother Deceased   . Maternal Grandfather Deceased   . Paternal Grandmother Deceased   . Paternal Grandfather Deceased    History reviewed. No pertinent family history.   ROS:  Full 14 point review of systems complete and found to be negative unless listed above.  Physical Exam: Blood pressure 137/49, pulse 65, temperature 98.1 F (36.7 C), temperature source Oral, resp. rate 18, SpO2 93 %.  General: ill appering  male in no acute distress. Sleeping, arouse with touch, answers questions inapprotiately Head: Eyes PERRLA, No xanthomas. Normocephalic and atraumatic, oropharynx without edema or exudate.  Lungs: Resp regular and unlabored, CTA. Heart: RRR no s3, s4, or murmurs..   Neck: No carotid bruits. No lymphadenopathy.  No JVD. Abdomen: Bowel sounds present, abdomen soft and non-tender without masses or hernias noted. Msk:  No spine or cva tenderness. No weakness, no joint deformities or effusions. Extremities: No clubbing, cyanosis or edema. DP/PT/Radials 1+ and equal bilaterally. Neuro: No focal deficits noted. Unknown years, states he is at home, does not know president name or season Psych:  Good affect, responds appropriately Skin: No rashes or lesions noted.  Labs:   Lab Results  Component Value Date   WBC 17.2* 04/12/2015   HGB 12.4* 04/12/2015   HCT 37.7* 04/12/2015   MCV 90.8 04/12/2015   PLT 220 04/12/2015   No results for input(s): INR in the last 72 hours.  Recent Labs Lab 04/12/15 2328  NA 135  K 4.4   CL 102  CO2 24  BUN 38*  CREATININE 1.77*  CALCIUM 9.0  PROT 6.9  BILITOT 0.6  ALKPHOS 77  ALT 28  AST 42*  GLUCOSE 260*  ALBUMIN 3.5   LIPASE  Date/Time Value Ref Range Status  04/12/2015 11:28 PM 17* 22 - 51 U/L Final   No results found for: TSH, T4TOTAL, T3FREE, THYROIDAB No results found for: VITAMINB12, FOLATE, FERRITIN, TIBC, IRON, RETICCTPCT  Echo: 09/2010  Normal LV size with proximal septal thickening, but normal EF at greater than 55% with mild impaired relaxation. Mildly sclerotic, but not stenotic, aortic valve with aortic sclerosis noted, but no other significant lesions or abnormalities, just some left atrial calcification.    ECG:  Vent. rate 57 BPM PR interval 272 ms QRS duration 84 ms QT/QTc 410/399 ms P-R-T axes 28 -10 25  Radiology:  Ct Abdomen Pelvis Wo  Contrast  04/12/2015   ADDENDUM REPORT: 04/12/2015 19:46 ADDENDUM: These results were called by telephone at the time of interpretation on 04/12/2015 at 7:35 pm to Dr. Renne Crigler, who verbally acknowledged these results. Electronically Signed   By: Bary Richard M.D.   On: 04/12/2015 19:46  04/12/2015   CLINICAL DATA:  Right-sided abdominal pain for 3 days, nausea. History of diabetes and hypertension.  EXAM: CT ABDOMEN AND PELVIS WITHOUT CONTRAST  TECHNIQUE: Multidetector CT imaging of the abdomen and pelvis was performed following the standard protocol without IV contrast.  COMPARISON:  None.  FINDINGS: There are multiple stones within the gallbladder, including a stone (or collection of stones) in the region of the gallbladder neck/ cystic duct. Gallbladder is distended and there is gallbladder wall thickening and pericholecystic edema highly suggestive of an acute cholecystitis.  Liver, spleen, pancreas, and adrenal glands appear normal. A mixture of hypodense and hyperdense masses are seen throughout both kidneys, difficult to definitively characterize without IV contrast, but most suggestive of a combination of  simple and hemorrhagic cysts. No renal stone or hydronephrosis bilaterally.  Bowel is normal in caliber. Scattered diverticulosis noted within the descending and sigmoid colon without evidence of acute diverticulitis. Appendix is normal. No bowel wall thickening or evidence of bowel wall inflammation seen. Prostate gland is moderately enlarged causing slight mass effect on the bladder base.  No focal fluid collection or abscess. No free intraperitoneal air. Atherosclerotic changes are seen throughout the normal- caliber abdominal aorta.  Degenerative changes are seen throughout the thoracolumbar spine but no acute osseous abnormality. Ill-defined consolidations at each lung base are probably atelectasis or confluent scarring.  IMPRESSION: 1. Findings highly suggestive of acute cholecystitis with gallbladder distension, gallbladder wall thickening, and pericholecystic edema. At least 2 stones, largest measuring approximately 1 cm greatest dimension, likely impacted within the gallbladder neck or cystic duct. Multiple additional gallstones in the gallbladder fundus region. 2. Probable simple cysts and hemorrhagic cysts within each kidney. Would consider follow-up renal ultrasound or contrast-enhanced renal CT at some point for confirmation of benignity. 3. Colonic diverticulosis without evidence of acute diverticulitis. 4. Consolidations at each lung base, right greater than left, most likely atelectasis or chronic scarring. Pneumonia felt to be less likely.  Electronically Signed: By: Bary Richard M.D. On: 04/12/2015 19:33   Dg Chest Port 1 View  04/13/2015   CLINICAL DATA:  Preop for a cholecystectomy. History of hypertension and previous smoking history.  EXAM: PORTABLE CHEST 1 VIEW  COMPARISON:  04/12/2015  FINDINGS: Cardiac silhouette is normal in size and configuration. No mediastinal or hilar masses or evidence of adenopathy. Mild elevation the right hemidiaphragm, stable. Mild right lung base atelectasis  or scarring, also stable. Lungs otherwise clear. No pleural effusion or pneumothorax.  IMPRESSION: No acute cardiopulmonary disease.   Electronically Signed   By: Amie Portland M.D.   On: 04/13/2015 10:07   US Abdomen Limited Ruq  04/13/2015   CLINICAL DATA:  Right-sided abdominal pain and nausea, 3 days duration.  EXAM: US ABDOMEN LIMITED - RIGHT UPPER QUADRANT  COMPARISON:  CT 04/12/2015  FINDINGS: Gallbladder:  The gallbladder wall is thickened and edematous, 7 mm. There are gallstones measuring up to 14 mm. The patient did not appear to be tender over the gallbladder.  Common bile duct:  Diameter: 4.0 mm, normal.  Liver:  No focal lesion identified. Within normal limits in parenchymal echogenicity.  IMPRESSION: Gallbladder mural thickening and edema. Cholelithiasis. The findings could represent acute cholecystitis.   Electronically  Signed   By: Ellery Plunk M.D.   On: 04/13/2015 00:17    ASSESSMENT AND PLAN:    1. Sinus arrhtymias - EKG this morning concerning for AV block, however reviewing of tele seems PACs. He is ok to go for surgery per MD.  - Unknown previous cardiac history. - He is not on any AV blocking agent. Avoid  2. Gallbladder calculus with acute cholecystitis and no obstruction - Plan for lab chole today  3. HTN - Relatively stable - Hold ACE due to AKI  4. AKI - repeat in morning  5. DM - On insulin  Signed: Bhagat,Bhavinkumar, PA 04/13/2015, 3:59 PM Pager [phone]  Co-Sign MD  Patient seen, examined. Available data reviewed. Agree with findings, assessment, and plan as outlined by Chelsea Aus, PA-C. Pt interviewed and examined. Initially he was confused when interviewed by Mr Iver Nestle, but he has cleared at the time of my interview. He denies any cardiac symptoms. Exam shows no edema, clear lungs, and a regular heart rate with no murmur. EKG shows sinus rhythm with PAC's and block, but no high-grade heart block and no bradycardia. He is at low cardiac risk of  cholecystectomy other than his advanced age. I don't think any further preoperative cardiac testing is required. Would avoid AV nodal blocking agents. Please call if any questions. Thanks.  Tonny Bollman, M.D. 04/13/2015 5:36 PM

## 2015-04-13 NOTE — ED Notes (Signed)
Mrs Spelman can be reached at [phone] and is going home to shower and change

## 2015-04-13 NOTE — Progress Notes (Signed)
Cardiology evaluation appreciated. He is not felt to be an undue risk for surgery. I discussed this again with the patient and his wife. I believe he would be best served with laparoscopic cholecystectomy due to apparently fairly severe acute cholecystitis. I discussed option of percutaneous drainage. Pros and cons of each were discussed. I discussed the nature of surgery including risks of anesthetic complications, bleeding, infection, bile leak, injury to surrounding structures and possible need for open procedure. I discussed the advantages of more definitive treatment without recurrent problems in the future. After discussion they would like to proceed with surgery. We will schedule this for tomorrow.

## 2015-04-13 NOTE — Progress Notes (Signed)
Patient in and out of first degree heart block and second degree type 2. CCS notified, cardiology consult in place.  Earnest Conroy. Clelia Croft, RN

## 2015-04-13 NOTE — Anesthesia Preprocedure Evaluation (Signed)
Anesthesia Evaluation  Patient identified by MRN, date of birth, ID band Patient awake    Reviewed: Allergy & Precautions, H&P , NPO status , Patient's Chart, lab work & pertinent test results  Airway Mallampati: II  TM Distance: >3 FB Neck ROM: full    Dental no notable dental hx. (+) Dental Advisory Given   Pulmonary neg pulmonary ROS, former smoker,    Pulmonary exam normal breath sounds clear to auscultation       Cardiovascular Exercise Tolerance: Good hypertension, Pt. on medications Normal cardiovascular exam Rhythm:regular Rate:Normal     Neuro/Psych negative neurological ROS  negative psych ROS   GI/Hepatic negative GI ROS, Neg liver ROS,   Endo/Other  diabetes, Well Controlled, Type 2, Oral Hypoglycemic Agents  Renal/GU negative Renal ROS  negative genitourinary   Musculoskeletal   Abdominal   Peds  Hematology negative hematology ROS (+)   Anesthesia Other Findings   Reproductive/Obstetrics negative OB ROS                             Anesthesia Physical Anesthesia Plan  ASA: III  Anesthesia Plan: General   Post-op Pain Management:    Induction: Intravenous  Airway Management Planned: Oral ETT  Additional Equipment:   Intra-op Plan:   Post-operative Plan: Extubation in OR  Informed Consent: I have reviewed the patients History and Physical, chart, labs and discussed the procedure including the risks, benefits and alternatives for the proposed anesthesia with the patient or authorized representative who has indicated his/her understanding and acceptance.   Dental Advisory Given  Plan Discussed with: CRNA and Surgeon  Anesthesia Plan Comments:         Anesthesia Quick Evaluation

## 2015-04-13 NOTE — ED Notes (Signed)
Alert x3  Skin very warm and dry rectal temp 101.3 will notified EDP for tylenol order. Pt on monitor AFib rate 60's and 70's denies SOB or pain. Wife at bedside

## 2015-04-14 ENCOUNTER — Observation Stay (HOSPITAL_COMMUNITY): Payer: Medicare Other | Admitting: Anesthesiology

## 2015-04-14 ENCOUNTER — Encounter (HOSPITAL_COMMUNITY): Payer: Self-pay | Admitting: Anesthesiology

## 2015-04-14 ENCOUNTER — Encounter (HOSPITAL_COMMUNITY): Admission: EM | Disposition: A | Discharge status: Home / Self Care | Payer: Self-pay | Source: Home / Self Care

## 2015-04-14 DIAGNOSIS — K8020 Calculus of gallbladder without cholecystitis without obstruction: Secondary | ICD-10-CM | POA: Diagnosis not present

## 2015-04-14 DIAGNOSIS — K8000 Calculus of gallbladder with acute cholecystitis without obstruction: Principal | ICD-10-CM

## 2015-04-14 HISTORY — PX: CHOLECYSTECTOMY: SHX55

## 2015-04-14 LAB — BASIC METABOLIC PANEL
Anion gap: 9 (ref 5–15)
BUN: 29 mg/dL — AB (ref 6–20)
CHLORIDE: 107 mmol/L (ref 101–111)
CO2: 22 mmol/L (ref 22–32)
Calcium: 8 mg/dL — ABNORMAL LOW (ref 8.9–10.3)
Creatinine, Ser: 1.53 mg/dL — ABNORMAL HIGH (ref 0.61–1.24)
GFR calc Af Amer: 44 mL/min — ABNORMAL LOW (ref >60)
GFR calc non Af Amer: 38 mL/min — ABNORMAL LOW (ref >60)
Glucose, Bld: 157 mg/dL — ABNORMAL HIGH (ref 65–99)
POTASSIUM: 4.3 mmol/L (ref 3.5–5.1)
Sodium: 138 mmol/L (ref 135–145)

## 2015-04-14 LAB — GLUCOSE, CAPILLARY
GLUCOSE-CAPILLARY: 146 mg/dL — AB (ref 65–99)
GLUCOSE-CAPILLARY: 218 mg/dL — AB (ref 65–99)
Glucose-Capillary: 264 mg/dL — ABNORMAL HIGH (ref 65–99)
Glucose-Capillary: 196 mg/dL — ABNORMAL HIGH (ref 65–99)
Glucose-Capillary: 273 mg/dL — ABNORMAL HIGH (ref 65–99)

## 2015-04-14 LAB — CBC
HCT: 33.5 % — ABNORMAL LOW (ref 39.0–52.0)
HEMOGLOBIN: 11 g/dL — AB (ref 13.0–17.0)
MCH: 29.7 pg (ref 26.0–34.0)
MCHC: 32.8 g/dL (ref 30.0–36.0)
MCV: 90.5 fL (ref 78.0–100.0)
Platelets: 254 10*3/uL (ref 150–400)
RBC: 3.7 MIL/uL — AB (ref 4.22–5.81)
RDW: 13.5 % (ref 11.5–15.5)
WBC: 14.2 10*3/uL — ABNORMAL HIGH (ref 4.0–10.5)

## 2015-04-14 LAB — PHOSPHORUS: Phosphorus: 2.9 mg/dL (ref 2.5–4.6)

## 2015-04-14 LAB — MAGNESIUM: Magnesium: 2.1 mg/dL (ref 1.7–2.4)

## 2015-04-14 SURGERY — LAPAROSCOPIC CHOLECYSTECTOMY WITH INTRAOPERATIVE CHOLANGIOGRAM
Anesthesia: General | Site: Abdomen

## 2015-04-14 MED ORDER — BUPIVACAINE-EPINEPHRINE (PF) 0.25% -1:200000 IJ SOLN
INTRAMUSCULAR | Status: AC
  Filled 2015-04-14: qty 30

## 2015-04-14 MED ORDER — SUGAMMADEX SODIUM 200 MG/2ML IV SOLN
INTRAVENOUS | Status: DC | PRN
  Administered 2015-04-14: 140 mg via INTRAVENOUS

## 2015-04-14 MED ORDER — LIDOCAINE HCL (CARDIAC) 20 MG/ML IV SOLN
INTRAVENOUS | Status: DC | PRN
  Administered 2015-04-14: 40 mg via INTRAVENOUS

## 2015-04-14 MED ORDER — PROPOFOL 10 MG/ML IV BOLUS
INTRAVENOUS | Status: AC
  Filled 2015-04-14: qty 20

## 2015-04-14 MED ORDER — SUGAMMADEX SODIUM 200 MG/2ML IV SOLN
INTRAVENOUS | Status: AC
  Filled 2015-04-14: qty 2

## 2015-04-14 MED ORDER — DEXAMETHASONE SODIUM PHOSPHATE 10 MG/ML IJ SOLN
INTRAMUSCULAR | Status: DC | PRN
  Administered 2015-04-14: 10 mg via INTRAVENOUS

## 2015-04-14 MED ORDER — FENTANYL CITRATE (PF) 250 MCG/5ML IJ SOLN
INTRAMUSCULAR | Status: AC
  Filled 2015-04-14: qty 25

## 2015-04-14 MED ORDER — 0.9 % SODIUM CHLORIDE (POUR BTL) OPTIME
TOPICAL | Status: DC | PRN
  Administered 2015-04-14: 1000 mL

## 2015-04-14 MED ORDER — FENTANYL CITRATE (PF) 100 MCG/2ML IJ SOLN: 25.0000 ug | INTRAMUSCULAR | Status: DC | PRN

## 2015-04-14 MED ORDER — FENTANYL CITRATE (PF) 100 MCG/2ML IJ SOLN
INTRAMUSCULAR | Status: DC | PRN
  Administered 2015-04-14 (×4): 50 ug via INTRAVENOUS
  Administered 2015-04-14: 25 ug via INTRAVENOUS

## 2015-04-14 MED ORDER — EPHEDRINE SULFATE 50 MG/ML IJ SOLN
INTRAMUSCULAR | Status: AC
  Filled 2015-04-14: qty 1

## 2015-04-14 MED ORDER — LIDOCAINE HCL (CARDIAC) 20 MG/ML IV SOLN
INTRAVENOUS | Status: AC
  Filled 2015-04-14: qty 5

## 2015-04-14 MED ORDER — ROCURONIUM BROMIDE 100 MG/10ML IV SOLN
INTRAVENOUS | Status: DC | PRN
  Administered 2015-04-14: 5 mg via INTRAVENOUS
  Administered 2015-04-14: 20 mg via INTRAVENOUS
  Administered 2015-04-14: 10 mg via INTRAVENOUS

## 2015-04-14 MED ORDER — LACTATED RINGERS IR SOLN
Status: DC | PRN
  Administered 2015-04-14: 3000 mL

## 2015-04-14 MED ORDER — DEXAMETHASONE SODIUM PHOSPHATE 10 MG/ML IJ SOLN
INTRAMUSCULAR | Status: AC
  Filled 2015-04-14: qty 2

## 2015-04-14 MED ORDER — SUCCINYLCHOLINE CHLORIDE 20 MG/ML IJ SOLN
INTRAMUSCULAR | Status: DC | PRN
  Administered 2015-04-14: 80 mg via INTRAVENOUS

## 2015-04-14 MED ORDER — ONDANSETRON HCL 4 MG/2ML IJ SOLN
INTRAMUSCULAR | Status: AC
  Filled 2015-04-14: qty 2

## 2015-04-14 MED ORDER — ONDANSETRON HCL 4 MG/2ML IJ SOLN: 4.0000 mg | Freq: Once | INTRAMUSCULAR | Status: DC | PRN

## 2015-04-14 MED ORDER — BUPIVACAINE-EPINEPHRINE 0.25% -1:200000 IJ SOLN
INTRAMUSCULAR | Status: DC | PRN
  Administered 2015-04-14: 20 mL

## 2015-04-14 MED ORDER — PROPOFOL 10 MG/ML IV BOLUS
INTRAVENOUS | Status: DC | PRN
  Administered 2015-04-14: 100 mg via INTRAVENOUS

## 2015-04-14 MED ORDER — LACTATED RINGERS IV SOLN
INTRAVENOUS | Status: DC | PRN
  Administered 2015-04-14: 10:00:00 via INTRAVENOUS

## 2015-04-14 SURGICAL SUPPLY — 34 items
APPLIER CLIP ROT 10 11.4 M/L (STAPLE) ×3
CATH REDDICK CHOLANGI 4FR 50CM (CATHETERS) IMPLANT
CHLORAPREP W/TINT 26ML (MISCELLANEOUS) ×3 IMPLANT
CLIP APPLIE ROT 10 11.4 M/L (STAPLE) ×1 IMPLANT
COVER MAYO STAND STRL (DRAPES) ×3 IMPLANT
COVER SURGICAL LIGHT HANDLE (MISCELLANEOUS) IMPLANT
DECANTER SPIKE VIAL GLASS SM (MISCELLANEOUS) ×3 IMPLANT
DRAIN CHANNEL RND F F (WOUND CARE) ×3 IMPLANT
DRAPE C-ARM 42X120 X-RAY (DRAPES) ×3 IMPLANT
DRAPE LAPAROSCOPIC ABDOMINAL (DRAPES) ×3 IMPLANT
ELECT REM PT RETURN 9FT ADLT (ELECTROSURGICAL) ×3
ELECTRODE REM PT RTRN 9FT ADLT (ELECTROSURGICAL) ×1 IMPLANT
EVACUATOR SILICONE 100CC (DRAIN) ×3 IMPLANT
GLOVE BIOGEL PI IND STRL 7.5 (GLOVE) ×1 IMPLANT
GLOVE BIOGEL PI INDICATOR 7.5 (GLOVE) ×2
GLOVE ECLIPSE 7.5 STRL STRAW (GLOVE) ×3 IMPLANT
GOWN STRL REUS W/TWL XL LVL3 (GOWN DISPOSABLE) ×12 IMPLANT
HEMOSTAT SNOW SURGICEL 2X4 (HEMOSTASIS) ×3 IMPLANT
HEMOSTAT SURGICEL 4X8 (HEMOSTASIS) IMPLANT
KIT BASIN OR (CUSTOM PROCEDURE TRAY) ×3 IMPLANT
LIQUID BAND (GAUZE/BANDAGES/DRESSINGS) ×3 IMPLANT
POUCH SPECIMEN RETRIEVAL 10MM (ENDOMECHANICALS) IMPLANT
SCISSORS LAP 5X35 DISP (ENDOMECHANICALS) ×3 IMPLANT
SET CHOLANGIOGRAPH MIX (MISCELLANEOUS) ×3 IMPLANT
SET IRRIG TUBING LAPAROSCOPIC (IRRIGATION / IRRIGATOR) ×3 IMPLANT
SLEEVE XCEL OPT CAN 5 100 (ENDOMECHANICALS) ×3 IMPLANT
SPONGE DRAIN TRACH 4X4 STRL 2S (GAUZE/BANDAGES/DRESSINGS) ×3 IMPLANT
SUT ETHILON 2 0 PS N (SUTURE) ×3 IMPLANT
SUT MNCRL AB 4-0 PS2 18 (SUTURE) ×3 IMPLANT
TAPE CLOTH SURG 4X10 WHT LF (GAUZE/BANDAGES/DRESSINGS) ×3 IMPLANT
TRAY LAPAROSCOPIC (CUSTOM PROCEDURE TRAY) ×3 IMPLANT
TROCAR BLADELESS OPT 5 100 (ENDOMECHANICALS) ×3 IMPLANT
TROCAR XCEL BLUNT TIP 100MML (ENDOMECHANICALS) ×3 IMPLANT
TROCAR XCEL NON-BLD 11X100MML (ENDOMECHANICALS) ×3 IMPLANT

## 2015-04-14 NOTE — Progress Notes (Signed)
Pt had a 10 beat run of V-tach this am.  No complaints of chest pain or shortness of breath.  Notified NP on call, no new orders at this time.

## 2015-04-14 NOTE — Op Note (Signed)
Preoperative Diagnosis: Cholelithiasis and acute cholecystitis  Postoprative Diagnosis: Same  Procedure: Procedure(s): LAPAROSCOPIC CHOLECYSTECTOMY   Surgeon: Glenna Fellows T   Assistants: Ovidio Kin  Anesthesia:  General endotracheal anesthesia  Indications: Patient is a 81 year old male in generally very good health for his age who presents with 5-6 days of initially severe right upper quadrant abdominal pain that improved and then recurred again in a couple of days later. He was admitted to the hospital with elevated white count. CT scan has shown cholelithiasis and significant acute cholecystitis. He has received IV fluids and IV antibiotics and I discussed with the patient and his wife options including percutaneous drainage and cholecystectomy. He appears to be an adequate surgical candidate and after discussion we have elected to proceed with urgent laparoscopic cholecystectomy. The procedure and its nature and risks have been discussed extensively and detailed elsewhere and they're in agreement.    Procedure Detail: Patient was brought to the operating room, placed in the supine position on the operating table, and general endotracheal anesthesia induced. He was already on broad-spectrum IV antibiotics. The abdomen was widely sterilely prepped and draped. PAS were in place. Patient timeout was performed and correct procedure verified. Access was attained with a 1 cm incision at the umbilicus with an open Hassan technique through a mattress suture of 0 Vicryl and pneumoperitoneum established. Under direct vision an 11 mm trocar was placed subxiphoid and 25 mm trochars along the right subcostal margin. There were inflammatory adhesions of the colon and omentum up to the gallbladder which were carefully taken down with blunt dissection without difficulty revealing a tensely distended and acutely inflamed gallbladder with patchy gangrene. The gallbladder was decompressed with an  aspiration needle to allow the fundus to be grasped and elevated up over the liver. The infundibulum was retracted laterally exposing the distal gallbladder and close triangle. An additional 5 mm trocar was placed in the left mid abdomen to retract the hepatoduodenal ligament and colon which were was somewhat distended with gas. The peritoneum over close triangle and the distal gallbladder was incised anteriorly and posteriorly. The gallbladder wall was almost completely necrotic. With careful mostly blunt dissection the distal gallbladder and close triangle was dissected and I came down to the cystic duct which was of normal size and clearly appeared to be going up into the gallbladder. This was carefully bluntly encircled and then doubly clipped proximally and clipped distally and divided. This tissue was viable although the gallbladder just beyond the cystic duct was completely necrotic. A viable cystic artery was also clearly identified coursing up onto the gallbladder wall in close triangle this was divided near the gallbladder with 2 proximal and 1 distal clip. Following this the gallbladder was tediously dissected out of its bed. The posterior wall was completely necrotic. The gallbladder was entered during this dissection and multiple large stones were removed. The dissection plane was difficult due to necrosis and acute inflammation but the gallbladder was detached from the liver with a moderate amount of oozing from the liver bed. The gallbladder was placed in an Endo Catch bag and brought out through the umbilical incision. The right upper quadrant was thoroughly irrigated. A few loose stones were removed and we did not see any further spillage of stones with copious irrigation and inspection. The using for the gallbladder bed at this point just about stopped and this was further completely controlled with cautery and a Surgicel pad. Complete hemostasis was assured. No evidence of ongoing bleeding or  other  problems. A 19 Blake close suction drain was left in the gallbladder fossa and brought out through one of the right lateral trocar sites. All CO2 was evacuated and trochars removed and a mattress suture secured at the umbilicus. Skin incisions were closed with subcuticular Monocryl and Dermabond. Sponge needle and instrument counts were correct.    Findings: Severe acute gangrenous cholecystitis and cholelithiasis  Estimated Blood Loss:  200 mL         Drains: 19 round Blake in right upper quadrant  Blood Given: none          Specimens: Gallbladder and contents        Complications:  * No complications entered in OR log *         Disposition: PACU - hemodynamically stable.         Condition: stable

## 2015-04-14 NOTE — Transfer of Care (Signed)
Immediate Anesthesia Transfer of Care Note  Patient: Alexander Powell  Procedure(s) Performed: Procedure(s): LAPAROSCOPIC CHOLECYSTECTOMY (N/A)  Patient Location: PACU  Anesthesia Type:General  Level of Consciousness: sedated  Airway & Oxygen Therapy: Patient Spontanous Breathing and Patient connected to face mask oxygen  Post-op Assessment: Report given to RN and Post -op Vital signs reviewed and stable  Post vital signs: Reviewed and stable  Last Vitals:  Filed Vitals:   04/14/15 0436  BP: 108/60  Pulse: 73  Temp: 36.8 C  Resp: 18    Complications: No apparent anesthesia complications

## 2015-04-14 NOTE — Anesthesia Preprocedure Evaluation (Addendum)
Anesthesia Evaluation  Patient identified by MRN, date of birth, ID band Patient awake    Reviewed: Allergy & Precautions, NPO status , Patient's Chart, lab work & pertinent test results  History of Anesthesia Complications Negative for: history of anesthetic complications  Airway Mallampati: II  TM Distance: >3 FB Neck ROM: Full    Dental  (+) Dental Advisory Given, Edentulous Upper, Edentulous Lower, Lower Dentures, Upper Dentures   Pulmonary former smoker,    Pulmonary exam normal breath sounds clear to auscultation       Cardiovascular hypertension, Pt. on medications negative cardio ROS Normal cardiovascular exam Rhythm:Regular Rate:Normal  Seen by Cardiology 04/13/15 for concern of AV block on preop EKG.   Per Cardiology note:   EKG shows sinus rhythm with PAC's and block, but no high-grade heart block and no bradycardia. He is at low cardiac risk of cholecystectomy other than his advanced age. I don't think any further preoperative cardiac testing is required. Would avoid AV nodal blocking agents. Please call if any questions. Thanks.    Neuro/Psych negative neurological ROS  negative psych ROS   GI/Hepatic negative GI ROS, Neg liver ROS,   Endo/Other  diabetes, Type 2, Oral Hypoglycemic Agents  Renal/GU Renal InsufficiencyRenal disease     Musculoskeletal negative musculoskeletal ROS (+)   Abdominal   Peds  Hematology  (+) Blood dyscrasia, anemia ,   Anesthesia Other Findings Day of surgery medications reviewed with the patient.  Reproductive/Obstetrics                           Anesthesia Physical Anesthesia Plan  ASA: III  Anesthesia Plan: General   Post-op Pain Management:    Induction: Intravenous  Airway Management Planned: Oral ETT  Additional Equipment:   Intra-op Plan:   Post-operative Plan: Extubation in OR  Informed Consent: I have reviewed the patients  History and Physical, chart, labs and discussed the procedure including the risks, benefits and alternatives for the proposed anesthesia with the patient or authorized representative who has indicated his/her understanding and acceptance.   Dental advisory given  Plan Discussed with: CRNA  Anesthesia Plan Comments: (Risks/benefits of general anesthesia discussed with patient including risk of damage to teeth, lips, gum, and tongue, nausea/vomiting, allergic reactions to medications, and the possibility of heart attack, stroke and death.  All patient questions answered.  Patient wishes to proceed.)        Anesthesia Quick Evaluation

## 2015-04-14 NOTE — Progress Notes (Signed)
Patient ID: Alexander Powell, male   DOB: March 11, 1921, 81 y.o.   MRN: [id number]  Preop evaluation per Dr Earmon Phoenix original consult note. No new cardiac recs at this time, please call if needed over the weekend.     Antoine Poche, M.D.

## 2015-04-14 NOTE — Progress Notes (Signed)
Patient had 5 beats of asymptomatic V-tach.  Notified NP on call.  No new orders at this time.  Will continue to monitor patient.

## 2015-04-14 NOTE — Anesthesia Procedure Notes (Signed)
Procedure Name: Intubation Date/Time: 04/14/2015 10:02 AM Performed by: Leroy Libman L Patient Re-evaluated:Patient Re-evaluated prior to inductionOxygen Delivery Method: Circle system utilized Preoxygenation: Pre-oxygenation with 100% oxygen Intubation Type: IV induction Ventilation: Mask ventilation without difficulty and Oral airway inserted - appropriate to patient size Laryngoscope Size: Miller and 3 Grade View: Grade I Tube type: Oral Tube size: 8.0 mm Number of attempts: 1 Airway Equipment and Method: Stylet Placement Confirmation: ETT inserted through vocal cords under direct vision,  positive ETCO2 and breath sounds checked- equal and bilateral Secured at: 21 cm Tube secured with: Tape Dental Injury: Teeth and Oropharynx as per pre-operative assessment

## 2015-04-14 NOTE — Anesthesia Postprocedure Evaluation (Signed)
  Anesthesia Post-op Note  Patient: Alexander Powell  Procedure(s) Performed: Procedure(s): LAPAROSCOPIC CHOLECYSTECTOMY (N/A)  Patient Location: PACU  Anesthesia Type:General  Level of Consciousness: awake, alert  and patient cooperative; slightly confused in PACU, but without his hearing aids. Neuro appears at baseline compared to preop mental status.  Airway and Oxygen Therapy: Patient Spontanous Breathing  Post-op Pain: none  Post-op Assessment: Post-op Vital signs reviewed, Patient's Cardiovascular Status Stable, Respiratory Function Stable, Patent Airway, No signs of Nausea or vomiting and Pain level controlled              Post-op Vital Signs: Reviewed  Last Vitals:  Filed Vitals:   04/14/15 1246  BP:   Pulse: 80  Temp:   Resp: 22    Complications: No apparent anesthesia complications

## 2015-04-15 DIAGNOSIS — E785 Hyperlipidemia, unspecified: Secondary | ICD-10-CM | POA: Diagnosis present

## 2015-04-15 DIAGNOSIS — K810 Acute cholecystitis: Secondary | ICD-10-CM | POA: Diagnosis present

## 2015-04-15 DIAGNOSIS — Z794 Long term (current) use of insulin: Secondary | ICD-10-CM | POA: Diagnosis not present

## 2015-04-15 DIAGNOSIS — E119 Type 2 diabetes mellitus without complications: Secondary | ICD-10-CM | POA: Diagnosis present

## 2015-04-15 DIAGNOSIS — R1011 Right upper quadrant pain: Secondary | ICD-10-CM | POA: Diagnosis present

## 2015-04-15 DIAGNOSIS — N179 Acute kidney failure, unspecified: Secondary | ICD-10-CM | POA: Diagnosis present

## 2015-04-15 DIAGNOSIS — D649 Anemia, unspecified: Secondary | ICD-10-CM | POA: Diagnosis present

## 2015-04-15 DIAGNOSIS — Z87891 Personal history of nicotine dependence: Secondary | ICD-10-CM | POA: Diagnosis not present

## 2015-04-15 DIAGNOSIS — I10 Essential (primary) hypertension: Secondary | ICD-10-CM | POA: Diagnosis present

## 2015-04-15 DIAGNOSIS — I440 Atrioventricular block, first degree: Secondary | ICD-10-CM | POA: Diagnosis present

## 2015-04-15 DIAGNOSIS — K8000 Calculus of gallbladder with acute cholecystitis without obstruction: Secondary | ICD-10-CM | POA: Diagnosis not present

## 2015-04-15 DIAGNOSIS — H9190 Unspecified hearing loss, unspecified ear: Secondary | ICD-10-CM | POA: Diagnosis present

## 2015-04-15 LAB — GLUCOSE, CAPILLARY
GLUCOSE-CAPILLARY: 230 mg/dL — AB (ref 65–99)
GLUCOSE-CAPILLARY: 194 mg/dL — AB (ref 65–99)
GLUCOSE-CAPILLARY: 364 mg/dL — AB (ref 65–99)
Glucose-Capillary: 106 mg/dL — ABNORMAL HIGH (ref 65–99)
Glucose-Capillary: 111 mg/dL — ABNORMAL HIGH (ref 65–99)
Glucose-Capillary: 233 mg/dL — ABNORMAL HIGH (ref 65–99)
Glucose-Capillary: 334 mg/dL — ABNORMAL HIGH (ref 65–99)

## 2015-04-15 LAB — CBC
HEMATOCRIT: 29.8 % — AB (ref 39.0–52.0)
Hemoglobin: 10 g/dL — ABNORMAL LOW (ref 13.0–17.0)
MCH: 29.7 pg (ref 26.0–34.0)
MCHC: 33.6 g/dL (ref 30.0–36.0)
MCV: 88.4 fL (ref 78.0–100.0)
PLATELETS: 263 10*3/uL (ref 150–400)
RBC: 3.37 MIL/uL — AB (ref 4.22–5.81)
RDW: 13.3 % (ref 11.5–15.5)
WBC: 13.6 10*3/uL — ABNORMAL HIGH (ref 4.0–10.5)

## 2015-04-15 MED ORDER — SODIUM CHLORIDE 0.9 % IJ SOLN: 3.0000 mL | INTRAMUSCULAR | Status: DC | PRN

## 2015-04-15 NOTE — Progress Notes (Signed)
General Surgery Note   POD -  1 Day Post-Op  Assessment/Plan: 1.  LAPAROSCOPIC CHOLECYSTECTOMY - 04/14/2015 - [facility]  For infarcted gall bladder  On Zosyn  Looks good.    2.  Some dysrythmias   Seen by cards pre op - but asymptomatic  Will plan follow up with cards on discharge  3.  DVT prophylaxis - on hold with beat up gall bladder bed/liver 4.  Poor hearing. 5.  Anemia  Hgb - 10.0 - 04/15/2015 6.  Elevated creatinine   Creatinine - 1.53 - 04/15/2015 7.  DM   Principal Problem:   Gallbladder calculus with acute cholecystitis and no obstruction Active Problems:   Gallbladder calculus with acute cholecystitis   Subjective:  Doing well.  Looks younger than 19.  But cannot hear without hearing aids.  Wife in room.  His PCP is Dr. Juleen China. Objective:   Filed Vitals:   04/15/15 0419  BP: 115/49  Pulse: 58  Temp: 99 F (37.2 C)  Resp: 20     Intake/Output from previous day:  10/08 0701 - 10/09 0700 In: 2425 [I.V.:2325; IV Piggyback:100] Out: 855 [Urine:600; Drains:80; Blood:175]  Intake/Output this shift:  Total I/O In: 600 [P.O.:600] Out: -    Physical Exam:   General: Older AA M who is alert and oriented.    HEENT: Normal. Pupils equal. .   Lungs: Clear   Abdomen: Soft   Wound: Clean.  Drain in RUQ - 80 cc recorded last 24 hours.     Lab Results:    Recent Labs  04/14/15 0445 04/15/15 0508  WBC 14.2* 13.6*  HGB 11.0* 10.0*  HCT 33.5* 29.8*  PLT 254 263    BMET   Recent Labs  04/12/15 2328 04/14/15 0445  NA 135 138  K 4.4 4.3  CL 102 107  CO2 24 22  GLUCOSE 260* 157*  BUN 38* 29*  CREATININE 1.77* 1.53*  CALCIUM 9.0 8.0*    PT/INR  No results for input(s): LABPROT, INR in the last 72 hours.  ABG  No results for input(s): PHART, HCO3 in the last 72 hours.  Invalid input(s): PCO2, PO2   Studies/Results:  No results found.   Anti-infectives:   Anti-infectives    Start     Dose/Rate Route Frequency Ordered Stop   04/13/15 0630   piperacillin-tazobactam (ZOSYN) IVPB 3.375 g     3.375 g 12.5 mL/hr over 240 Minutes Intravenous 3 times per day 04/13/15 0625     04/12/15 2300  piperacillin-tazobactam (ZOSYN) IVPB 3.375 g     3.375 g 12.5 mL/hr over 240 Minutes Intravenous  Once 04/12/15 2245 04/13/15 0411      Ovidio Kin, MD, FACS Pager: [phone] [facility] Office: [phone] 04/15/2015

## 2015-04-16 ENCOUNTER — Encounter (HOSPITAL_COMMUNITY): Payer: Self-pay | Admitting: General Surgery

## 2015-04-16 LAB — GLUCOSE, CAPILLARY
Glucose-Capillary: 231 mg/dL — ABNORMAL HIGH (ref 65–99)
Glucose-Capillary: 142 mg/dL — ABNORMAL HIGH (ref 65–99)
Glucose-Capillary: 123 mg/dL — ABNORMAL HIGH (ref 65–99)
Glucose-Capillary: 160 mg/dL — ABNORMAL HIGH (ref 65–99)
Glucose-Capillary: 159 mg/dL — ABNORMAL HIGH (ref 65–99)

## 2015-04-16 MED ORDER — HYDROCODONE-ACETAMINOPHEN 5-325 MG PO TABS: 1.0000 | ORAL_TABLET | ORAL | Status: DC | PRN

## 2015-04-16 MED ORDER — AMOXICILLIN-POT CLAVULANATE 875-125 MG PO TABS: 1.0000 | ORAL_TABLET | Freq: Two times a day (BID) | ORAL | Status: DC

## 2015-04-16 NOTE — Progress Notes (Signed)
Patient discharged to home with spouse. Instructions reviewed, medications, drain care and  f/u visit discussed. Questions denied. Pt dc with drain

## 2015-04-16 NOTE — Discharge Instructions (Signed)
[facility] ______[facility] °LAPAROSCOPIC SURGERY: POST OP INSTRUCTIONS °Always review your discharge instruction sheet given to you by the facility where your surgery was performed. °IF YOU HAVE DISABILITY OR FAMILY LEAVE FORMS, YOU MUST BRING THEM TO THE OFFICE FOR PROCESSING.   °DO NOT GIVE THEM TO YOUR DOCTOR. ° °1. A prescription for pain medication may be given to you upon discharge.  Take your pain medication as prescribed, if needed.  If narcotic pain medicine is not needed, then you may take acetaminophen (Tylenol) or ibuprofen (Advil) as needed. °2. Take your usually prescribed medications unless otherwise directed. °3. If you need a refill on your pain medication, please contact your pharmacy.  They will contact our office to request authorization. Prescriptions will not be filled after 5pm or on week-ends. °4. You should follow a light diet the first few days after arrival home, such as soup and crackers, etc.  Be sure to include lots of fluids daily. °5. Most patients will experience some swelling and bruising in the area of the incisions.  Ice packs will help.  Swelling and bruising can take several days to resolve.  °6. It is common to experience some constipation if taking pain medication after surgery.  Increasing fluid intake and taking a stool softener (such as Colace) will usually help or prevent this problem from occurring.  A mild laxative (Milk of Magnesia or Miralax) should be taken according to package instructions if there are no bowel movements after 48 hours. °7. Unless discharge instructions indicate otherwise, you may remove your bandages 24-48 hours after surgery, and you may shower at that time.  You may have steri-strips (small skin tapes) in place directly over the incision.  These strips should be left on the skin for 7-10 days.  If your surgeon used skin glue on the incision, you may shower in 24 hours.  The glue will flake off over the next 2-3 weeks.  Any sutures or  staples will be removed at the office during your follow-up visit. °8. ACTIVITIES:  You may resume regular (light) daily activities beginning the next day--such as daily self-care, walking, climbing stairs--gradually increasing activities as tolerated.  You may have sexual intercourse when it is comfortable.  Refrain from any heavy lifting or straining until approved by your doctor. °a. You may drive when you are no longer taking prescription pain medication, you can comfortably wear a seatbelt, and you can safely maneuver your car and apply brakes. °b. RETURN TO WORK:  __________________________________________________________ °9. You should see your doctor in the office for a follow-up appointment approximately 2-3 weeks after your surgery.  Make sure that you call for this appointment within a day or two after you arrive home to insure a convenient appointment time. °10. OTHER INSTRUCTIONS: __________________________________________________________________________________________________________________________ __________________________________________________________________________________________________________________________ °WHEN TO CALL YOUR DOCTOR: °1. Fever over 101.0 °2. Inability to urinate °3. Continued bleeding from incision. °4. Increased pain, redness, or drainage from the incision. °5. Increasing abdominal pain ° °The clinic staff is available to answer your questions during regular business hours.  Please don’t hesitate to call and ask to speak to one of the nurses for clinical concerns.  If you have a medical emergency, go to the nearest emergency room or call 911.  A surgeon from [facility] is always on call at the hospital. °[address], [address], [place], [place]  [address] ? [address] [address], [place], [place]   [address] °[phone] ? [phone] ? FAX [phone] °Web site:   [email or web address] °

## 2015-04-16 NOTE — Progress Notes (Signed)
Patient ID: Alexander Powell, male   DOB: 1920-07-31, 81 y.o.   MRN: [id number] 2 Days Post-Op  Subjective: Pt feels well today.  Minimal pain.  Walking some in halls with walker though.  Eating well.  Objective: Vital signs in last 24 hours: Temp:  [97.9 F (36.6 C)-98.7 F (37.1 C)] 98.4 F (36.9 C) (10/10 0446) Pulse Rate:  [63-65] 65 (10/10 0446) Resp:  [18-20] 20 (10/10 0446) BP: (131-149)/(46-60) 131/46 mmHg (10/10 0446) SpO2:  [93 %-96 %] 94 % (10/10 0446) Last BM Date: 04/15/15  Intake/Output from previous day: 10/09 0701 - 10/10 0700 In: 870 [P.O.:720; IV Piggyback:150] Out: 730 [Urine:650; Drains:80] Intake/Output this shift: Total I/O In: 300 [P.O.:300] Out: 175 [Urine:175]  PE: Abd: soft, appropriately tender, +BS, JP drain with serosang drainage, incisions c/d/i Heart: regular  Lab Results:   Recent Labs  04/14/15 0445 04/15/15 0508  WBC 14.2* 13.6*  HGB 11.0* 10.0*  HCT 33.5* 29.8*  PLT 254 263   BMET  Recent Labs  04/14/15 0445  NA 138  K 4.3  CL 107  CO2 22  GLUCOSE 157*  BUN 29*  CREATININE 1.53*  CALCIUM 8.0*   PT/INR No results for input(s): LABPROT, INR in the last 72 hours. CMP     Component Value Date/Time   NA 138 04/14/2015 0445   K 4.3 04/14/2015 0445   CL 107 04/14/2015 0445   CO2 22 04/14/2015 0445   GLUCOSE 157* 04/14/2015 0445   BUN 29* 04/14/2015 0445   CREATININE 1.53* 04/14/2015 0445   CALCIUM 8.0* 04/14/2015 0445   PROT 6.9 04/12/2015 2328   ALBUMIN 3.5 04/12/2015 2328   AST 42* 04/12/2015 2328   ALT 28 04/12/2015 2328   ALKPHOS 77 04/12/2015 2328   BILITOT 0.6 04/12/2015 2328   GFRNONAA 38* 04/14/2015 0445   GFRAA 44* 04/14/2015 0445   Lipase     Component Value Date/Time   LIPASE 17* 04/12/2015 2328       Studies/Results: No results found.  Anti-infectives: Anti-infectives    Start     Dose/Rate Route Frequency Ordered Stop   04/16/15 0000  amoxicillin-clavulanate (AUGMENTIN) 875-125 MG tablet      1 tablet Oral 2 times daily 04/16/15 1052     04/13/15 0630  piperacillin-tazobactam (ZOSYN) IVPB 3.375 g     3.375 g 12.5 mL/hr over 240 Minutes Intravenous 3 times per day 04/13/15 0625     04/12/15 2300  piperacillin-tazobactam (ZOSYN) IVPB 3.375 g     3.375 g 12.5 mL/hr over 240 Minutes Intravenous  Once 04/12/15 2245 04/13/15 0411       Assessment/Plan  POD 2, s/p lap chole with JP drain  -patient doing well today. -carb mod diet -PT eval to determine if patient needs some HH at home.  Pending their eval, plan for dc home later today.  RN will need to instruct patient and wife how to care for drain -follow up with Dr. Johna Sheriff next week for drain removal and follow up. -DC home on 5 more days of oral augmentin.  LOS: 1 day    Coral Timme E 04/16/2015, 10:52 AM Pager: [phone]

## 2015-04-16 NOTE — Discharge Summary (Signed)
Patient ID: Januel Doolan MRN: [id number] DOB/AGE: [birth year] 81 y.o.  Admit date: 04/12/2015 Discharge date: 04/16/2015  Procedures: lap chole  Consults: cardiology  Reason for Admission: patient is a 81 yo BM with 4 day hx of abd pain in the upper abdomen. Denies fever or chills. Mild nausea, no emesis. Referred to [facility] ER for evaluation by primary MD, Dr. Juleen China. CT scan with thickened gallbladder wall, USN with cholelithiasis. WBC elevated at 17K. IV abx's started in ER and surgery asked to evaluate for cholecystectomy.  No prior abdominal surgery per wife. HTN, but no acute cardiac issues. Denies jaundice or acholic stools.  Admission Diagnoses:  1. cholecystitis  Hospital Course: The patient was admitted and prior to going down for surgery he was noted to be mildly bradycardia and a preop EKG revealed first degree heart block.  He was continued on zosyn, but his surgery was cancelled so cardiology could evaluate him.  He was cleared and the following day taken to the OR where he underwent a lap chole for a gangrenous gallbladder.  He had a JP drain placed during surgery.  POD 1, the patient was doing fairly well, but needed to get out of bed and mobilize and as well advancement of his diet.  This was achieved.  On POD 2, PT evaluate the patient and recommended 24 hr supervision and wife declined HHPT.  The patient was otherwise felt stable at this time for DC home with his JP drain in place and 5 more days of an oral abx therapy.   Discharge Diagnoses:  Principal Problem:   Gallbladder calculus with acute cholecystitis and no obstruction Active Problems:   Gallbladder calculus with acute cholecystitis   Acute gangrenous cholecystitis s/p lap chole with JP drain First degree heart block  Discharge Medications:   Medication List    STOP taking these medications        cephALEXin 500 MG capsule  Commonly known as:  KEFLEX      TAKE these medications        amoxicillin-clavulanate 875-125 MG tablet  Commonly known as:  AUGMENTIN  Take 1 tablet by mouth 2 (two) times daily.     aspirin 81 MG tablet  Take 81 mg by mouth daily.     glipiZIDE 2.5 MG 24 hr tablet  Commonly known as:  GLUCOTROL XL  Take 2.5 mg by mouth daily with breakfast.     HYDROcodone-acetaminophen 5-325 MG tablet  Commonly known as:  NORCO/VICODIN  Take 1-2 tablets by mouth every 4 (four) hours as needed for moderate pain.     metFORMIN 500 MG 24 hr tablet  Commonly known as:  GLUCOPHAGE-XR  Take 500 mg by mouth 2 (two) times daily.     multivitamin with minerals Tabs tablet  Take 1 tablet by mouth daily.     pravastatin 80 MG tablet  Commonly known as:  PRAVACHOL  Take 80 mg by mouth daily.     ramipril 5 MG capsule  Commonly known as:  ALTACE  Take 5 mg by mouth daily.        Discharge Instructions:     Follow-up Information    Follow up with Mariella Saa, MD On 04/26/2015.   Specialty:  General Surgery   Why:  8:45am, arrive by 8:15am for paperwork   Contact information:   [address] [place] [address] [phone]       Signed: Letha Cape 04/16/2015, 3:23 PM

## 2015-04-16 NOTE — Evaluation (Signed)
Physical Therapy Evaluation Patient Details Name: Alexander Powell MRN: [id number] DOB: Dec 20, 1920 Today's Date: 04/16/2015   History of Present Illness  81 yo male s/p lap chole with jp drain.   Clinical Impression  On eval, pt was supervision assist for mobility-walked ~175 feet with RW. Pt tolerated activity well. Pt is weaker than baseline so instructed pt (and wife) to use RW for ambulation. Discussed d/c plan-wife declines HHPT.     Follow Up Recommendations Supervision/Assistance - 24 hour (wife declines HHPT. )    Equipment Recommendations  None recommended by PT (wife states RW available at home)    Recommendations for Other Services       Precautions / Restrictions Precautions Precautions: Fall Restrictions Weight Bearing Restrictions: No      Mobility  Bed Mobility               General bed mobility comments: pt oob in recliner  Transfers Overall transfer level: Needs assistance Equipment used: Rolling walker (2 wheeled) Transfers: Sit to/from Stand Sit to Stand: Supervision         General transfer comment: for safety.   Ambulation/Gait Ambulation/Gait assistance: Supervision Ambulation Distance (Feet): 175 Feet Assistive device: Rolling walker (2 wheeled) Gait Pattern/deviations: Step-through pattern;Trunk flexed     General Gait Details: good gait speed. VCs for walker safety.   Stairs            Wheelchair Mobility    Modified Rankin (Stroke Patients Only)       Balance                                             Pertinent Vitals/Pain Pain Assessment: No/denies pain    Home Living Family/patient expects to be discharged to:: Private residence Living Arrangements: Spouse/significant other Available Help at Discharge: Family Type of Home: House Home Access: Level entry     Home Layout: One level Home Equipment: Environmental consultant - 2 wheels;Cane - single point      Prior Function Level of Independence:  Independent               Hand Dominance        Extremity/Trunk Assessment   Upper Extremity Assessment: Generalized weakness           Lower Extremity Assessment: Generalized weakness      Cervical / Trunk Assessment: Kyphotic  Communication   Communication: No difficulties  Cognition Arousal/Alertness: Awake/alert Behavior During Therapy: WFL for tasks assessed/performed Overall Cognitive Status: Within Functional Limits for tasks assessed                      General Comments      Exercises        Assessment/Plan    PT Assessment Patient needs continued PT services  PT Diagnosis Difficulty walking;Generalized weakness   PT Problem List Decreased strength;Decreased balance;Decreased mobility  PT Treatment Interventions Gait training;DME instruction;Functional mobility training;Therapeutic activities;Patient/family education;Balance training;Therapeutic exercise   PT Goals (Current goals can be found in the Care Plan section) Acute Rehab PT Goals Patient Stated Goal: home PT Goal Formulation: All assessment and education complete, DC therapy    Frequency     Barriers to discharge        Co-evaluation               End of Session   Activity Tolerance: Patient  tolerated treatment well Patient left: in chair;with call bell/phone within reach;with chair alarm set;with family/visitor present           Time: 1610-9604 PT Time Calculation (min) (ACUTE ONLY): 14 min   Charges:   PT Evaluation $Initial PT Evaluation Tier I: 1 Procedure     PT G Codes:        Rebeca Alert, MPT Pager: [phone]

## 2015-04-16 NOTE — Progress Notes (Signed)
PT Cancellation Note  Patient Details Name: Alexander Powell MRN: [id number] DOB: Sep 12, 1920   Cancelled Treatment:    Reason Eval/Treat Not Completed: Pt eating lunch. Will check back later. Thanks.   Rebeca Alert, MPT Pager: [phone]

## 2015-04-18 DIAGNOSIS — Z09 Encounter for follow-up examination after completed treatment for conditions other than malignant neoplasm: Secondary | ICD-10-CM | POA: Diagnosis not present

## 2015-04-25 DIAGNOSIS — E118 Type 2 diabetes mellitus with unspecified complications: Secondary | ICD-10-CM | POA: Diagnosis not present

## 2015-04-25 DIAGNOSIS — E789 Disorder of lipoprotein metabolism, unspecified: Secondary | ICD-10-CM | POA: Diagnosis not present

## 2015-04-25 DIAGNOSIS — I10 Essential (primary) hypertension: Secondary | ICD-10-CM | POA: Diagnosis not present

## 2015-04-25 DIAGNOSIS — E119 Type 2 diabetes mellitus without complications: Secondary | ICD-10-CM | POA: Diagnosis not present

## 2015-04-25 DIAGNOSIS — Z23 Encounter for immunization: Secondary | ICD-10-CM | POA: Diagnosis not present

## 2015-05-04 ENCOUNTER — Ambulatory Visit (INDEPENDENT_AMBULATORY_CARE_PROVIDER_SITE_OTHER): Payer: Medicare Other | Admitting: Nurse Practitioner

## 2015-05-04 VITALS — BP 142/68 | HR 80 | Ht 67.0 in | Wt 153.6 lb

## 2015-05-04 DIAGNOSIS — I10 Essential (primary) hypertension: Secondary | ICD-10-CM

## 2015-05-04 DIAGNOSIS — E785 Hyperlipidemia, unspecified: Secondary | ICD-10-CM | POA: Diagnosis not present

## 2015-05-04 DIAGNOSIS — Z9049 Acquired absence of other specified parts of digestive tract: Secondary | ICD-10-CM

## 2015-05-04 NOTE — Patient Instructions (Addendum)
We will be checking the following labs today - NONE   Medication Instructions:    Continue with your current medicines.     Testing/Procedures To Be Arranged:  N/A  Follow-Up:   See us back as needed.     Other Special Instructions:   N/A    If you need a refill on your cardiac medications before your next appointment, please call your pharmacy.   Call the [facility] office at [phone] if you have any questions, problems or concerns.      

## 2015-05-04 NOTE — Progress Notes (Signed)
CARDIOLOGY OFFICE NOTE  Date:  05/04/2015    Alexander BakerAaron Powell Date of Birth: 04-17-1921 Medical Record #[id number]  PCP:  Michiel SitesKOHUT,WALTER DENNIS, MD  Cardiologist:  Excell Seltzerooper    No chief complaint on file.   History of Present Illness: Alexander Powell is a 81 y.o. male who presents today for a follow up visit. Seen for Dr. Excell Seltzerooper but [facility] remote past. Has a history of HTN, HLD and DM.  Presented earlier this month with abdominal pain - found to have gallbladder disease. Pre op clearance for sinus brady with 1st degree AV block. No cardiac symptoms reported. PACs on telemetry. Was felt to be low risk with no indication for further testing.   Comes back today. Here with his wife. Got married 5 years ago. Not clear why he is here. He is hard of hearing. No chest pain. Not short of breath. Has no real concerns. Eating well and bowels are working "excellent". Slowly getting back to his normal routines. He typically exercises regularly.   Past Medical History  Diagnosis Date  . Hypertension   . Hyperlipidemia   . DM (diabetes mellitus), type 2 (HCC)   . Hearing loss     pt wear hearing aid  . Hx of echocardiogram 09/2010    Normal LV size with proximal septal thickening, but normal EF at greater than 55% with mild impaired relaxation. Mildly sclerotic, but not stenotic, aortic valve with aortic sclerosis noted, but no other significant lesions or abnormalities, just some left atrial calcification.  Marland Kitchen. History of stress test 05/2006    Showed a mild to moderate perfusion defect in the apical wall with no reversibility. He also had a mild to moderate perfusion defect in the mid inferior wall with no reversibilty, and a mild to moderate perfusion defect in the apicl inferior wall with no reversibility. There is thought thatthis could be potentially due to infarct or scar, but not corroborated with his echo.     Past Surgical History  Procedure Laterality Date  . Back  surgery  1995  . Cholecystectomy N/A 04/14/2015    Procedure: LAPAROSCOPIC CHOLECYSTECTOMY;  Surgeon: Glenna FellowsBenjamin Hoxworth, MD;  Location: [facility];  Service: General;  Laterality: N/A;     Medications: Current Outpatient Prescriptions  Medication Sig Dispense Refill  . aspirin 81 MG tablet Take 81 mg by mouth daily.    Marland Kitchen. glipiZIDE (GLUCOTROL XL) 2.5 MG 24 hr tablet Take 2.5 mg by mouth daily with breakfast.    . metFORMIN (GLUCOPHAGE-XR) 500 MG 24 hr tablet Take 500 mg by mouth 2 (two) times daily.     . Multiple Vitamin (MULTIVITAMIN WITH MINERALS) TABS tablet Take 1 tablet by mouth daily.    . pravastatin (PRAVACHOL) 80 MG tablet Take 80 mg by mouth daily.     . ramipril (ALTACE) 5 MG capsule Take 5 mg by mouth daily.     No current facility-administered medications for this visit.    Allergies: No Known Allergies  Social History: The patient  reports that he quit smoking about 26 years ago. His smoking use included Cigarettes. He has never used smokeless tobacco. He reports that he drinks alcohol. He reports that he does not use illicit drugs.   Family History: The patient's family history is not on file.   Review of Systems: Please see the history of present illness.   Otherwise, the review of systems is positive for none.   All other systems are reviewed and  negative.   Physical Exam: VS:  BP 142/68 mmHg  Pulse 80  Ht  (1.702 m)  Wt 153 lb 9.6 oz (69.673 kg)  BMI 24.05 kg/m2  SpO2 100% .  BMI Body mass index is 24.05 kg/(m^2).  Wt Readings from Last 3 Encounters:  05/04/15 153 lb 9.6 oz (69.673 kg)  04/14/15 154 lb 15.7 oz (70.3 kg)    General: Pleasant. Well developed, well nourished and in no acute distress.  HEENT: Normal. Neck: Supple, no JVD, carotid bruits, or masses noted.  Cardiac: Regular rate and rhythm. No murmurs, rubs, or gallops. No edema.  Respiratory:  Lungs are clear to auscultation bilaterally with normal work of breathing.  GI: Soft and  nontender.  MS: No deformity or atrophy. Gait and ROM intact. Skin: Warm and dry. Color is normal.  Neuro:  Strength and sensation are intact and no gross focal deficits noted.  Psych: Alert, appropriate and with normal affect.   LABORATORY DATA:  EKG:  EKG is not ordered today.  Lab Results  Component Value Date   WBC 13.6* 04/15/2015   HGB 10.0* 04/15/2015   HCT 29.8* 04/15/2015   PLT 263 04/15/2015   GLUCOSE 157* 04/14/2015   ALT 28 04/12/2015   AST 42* 04/12/2015   NA 138 04/14/2015   K 4.3 04/14/2015   CL 107 04/14/2015   CREATININE 1.53* 04/14/2015   BUN 29* 04/14/2015   CO2 22 04/14/2015    BNP (last 3 results) No results for input(s): BNP in the last 8760 hours.  ProBNP (last 3 results) No results for input(s): PROBNP in the last 8760 hours.   Other Studies Reviewed Today:   Assessment/Plan: 1. Post op cholecystectomy  2. HTN - BP ok on current regimen.   3. HLD - on statin therapy.   No indication for further testing. He is doing well clinically. Will see back prn. Otherwise defer his management to PCP  Current medicines are reviewed with the patient today.  The patient does not have concerns regarding medicines other than what has been noted above.  The following changes have been made:  See above.  Labs/ tests ordered today include:   No orders of the defined types were placed in this encounter.     Disposition:   FU prn.     Patient is agreeable to this plan and will call if any problems develop in the interim.   Signed: Rosalio Macadamia, RN, ANP-C 05/04/2015 11:37 AM  [facility] [address] [place], [place]  [address] Phone: [phone] Fax: [phone]

## 2015-07-19 DIAGNOSIS — E119 Type 2 diabetes mellitus without complications: Secondary | ICD-10-CM | POA: Diagnosis not present

## 2015-07-19 DIAGNOSIS — E7889 Other lipoprotein metabolism disorders: Secondary | ICD-10-CM | POA: Diagnosis not present

## 2015-07-26 DIAGNOSIS — E118 Type 2 diabetes mellitus with unspecified complications: Secondary | ICD-10-CM | POA: Diagnosis not present

## 2015-07-26 DIAGNOSIS — E789 Disorder of lipoprotein metabolism, unspecified: Secondary | ICD-10-CM | POA: Diagnosis not present

## 2015-11-15 DIAGNOSIS — E119 Type 2 diabetes mellitus without complications: Secondary | ICD-10-CM | POA: Diagnosis not present

## 2015-11-15 DIAGNOSIS — E7889 Other lipoprotein metabolism disorders: Secondary | ICD-10-CM | POA: Diagnosis not present

## 2015-11-15 DIAGNOSIS — I10 Essential (primary) hypertension: Secondary | ICD-10-CM | POA: Diagnosis not present

## 2015-11-15 DIAGNOSIS — Z125 Encounter for screening for malignant neoplasm of prostate: Secondary | ICD-10-CM | POA: Diagnosis not present

## 2015-11-15 DIAGNOSIS — Z79899 Other long term (current) drug therapy: Secondary | ICD-10-CM | POA: Diagnosis not present

## 2015-11-22 DIAGNOSIS — E789 Disorder of lipoprotein metabolism, unspecified: Secondary | ICD-10-CM | POA: Diagnosis not present

## 2015-11-22 DIAGNOSIS — E118 Type 2 diabetes mellitus with unspecified complications: Secondary | ICD-10-CM | POA: Diagnosis not present

## 2015-11-22 DIAGNOSIS — R5383 Other fatigue: Secondary | ICD-10-CM | POA: Diagnosis not present

## 2016-03-13 DIAGNOSIS — M1990 Unspecified osteoarthritis, unspecified site: Secondary | ICD-10-CM | POA: Diagnosis not present

## 2016-04-03 DIAGNOSIS — Z23 Encounter for immunization: Secondary | ICD-10-CM | POA: Diagnosis not present

## 2016-04-03 DIAGNOSIS — R5383 Other fatigue: Secondary | ICD-10-CM | POA: Diagnosis not present

## 2016-04-03 DIAGNOSIS — M1990 Unspecified osteoarthritis, unspecified site: Secondary | ICD-10-CM | POA: Diagnosis not present

## 2016-07-08 DIAGNOSIS — M1990 Unspecified osteoarthritis, unspecified site: Secondary | ICD-10-CM | POA: Diagnosis not present

## 2016-07-08 DIAGNOSIS — I10 Essential (primary) hypertension: Secondary | ICD-10-CM | POA: Diagnosis not present

## 2016-07-08 DIAGNOSIS — Z23 Encounter for immunization: Secondary | ICD-10-CM | POA: Diagnosis not present

## 2016-07-08 DIAGNOSIS — E118 Type 2 diabetes mellitus with unspecified complications: Secondary | ICD-10-CM | POA: Diagnosis not present

## 2016-10-29 DIAGNOSIS — E119 Type 2 diabetes mellitus without complications: Secondary | ICD-10-CM | POA: Diagnosis not present

## 2016-10-29 DIAGNOSIS — I10 Essential (primary) hypertension: Secondary | ICD-10-CM | POA: Diagnosis not present

## 2016-10-29 DIAGNOSIS — E7889 Other lipoprotein metabolism disorders: Secondary | ICD-10-CM | POA: Diagnosis not present

## 2016-10-29 DIAGNOSIS — Z125 Encounter for screening for malignant neoplasm of prostate: Secondary | ICD-10-CM | POA: Diagnosis not present

## 2016-10-29 DIAGNOSIS — Z79899 Other long term (current) drug therapy: Secondary | ICD-10-CM | POA: Diagnosis not present

## 2016-11-05 DIAGNOSIS — E118 Type 2 diabetes mellitus with unspecified complications: Secondary | ICD-10-CM | POA: Diagnosis not present

## 2016-11-05 DIAGNOSIS — E789 Disorder of lipoprotein metabolism, unspecified: Secondary | ICD-10-CM | POA: Diagnosis not present

## 2016-11-05 DIAGNOSIS — I10 Essential (primary) hypertension: Secondary | ICD-10-CM | POA: Diagnosis not present

## 2016-12-24 DIAGNOSIS — E119 Type 2 diabetes mellitus without complications: Secondary | ICD-10-CM | POA: Diagnosis not present

## 2016-12-24 DIAGNOSIS — I10 Essential (primary) hypertension: Secondary | ICD-10-CM | POA: Diagnosis not present

## 2016-12-24 DIAGNOSIS — E7889 Other lipoprotein metabolism disorders: Secondary | ICD-10-CM | POA: Diagnosis not present

## 2017-01-29 DIAGNOSIS — E7889 Other lipoprotein metabolism disorders: Secondary | ICD-10-CM | POA: Diagnosis not present

## 2017-01-29 DIAGNOSIS — E119 Type 2 diabetes mellitus without complications: Secondary | ICD-10-CM | POA: Diagnosis not present

## 2017-02-05 ENCOUNTER — Encounter: Payer: Self-pay | Admitting: Internal Medicine

## 2017-02-05 DIAGNOSIS — I10 Essential (primary) hypertension: Secondary | ICD-10-CM | POA: Diagnosis not present

## 2017-02-05 DIAGNOSIS — E7800 Pure hypercholesterolemia, unspecified: Secondary | ICD-10-CM | POA: Diagnosis not present

## 2017-02-05 DIAGNOSIS — E875 Hyperkalemia: Secondary | ICD-10-CM | POA: Diagnosis not present

## 2017-02-05 DIAGNOSIS — E119 Type 2 diabetes mellitus without complications: Secondary | ICD-10-CM | POA: Diagnosis not present

## 2017-02-09 DIAGNOSIS — E875 Hyperkalemia: Secondary | ICD-10-CM | POA: Diagnosis not present

## 2017-02-10 DIAGNOSIS — E875 Hyperkalemia: Secondary | ICD-10-CM | POA: Diagnosis not present

## 2017-02-16 DIAGNOSIS — E875 Hyperkalemia: Secondary | ICD-10-CM | POA: Diagnosis not present

## 2017-02-17 DIAGNOSIS — E119 Type 2 diabetes mellitus without complications: Secondary | ICD-10-CM | POA: Diagnosis not present

## 2017-02-17 DIAGNOSIS — E7889 Other lipoprotein metabolism disorders: Secondary | ICD-10-CM | POA: Diagnosis not present

## 2017-02-17 DIAGNOSIS — I10 Essential (primary) hypertension: Secondary | ICD-10-CM | POA: Diagnosis not present

## 2017-02-17 DIAGNOSIS — E875 Hyperkalemia: Secondary | ICD-10-CM | POA: Diagnosis not present

## 2017-03-03 DIAGNOSIS — I10 Essential (primary) hypertension: Secondary | ICD-10-CM | POA: Diagnosis not present

## 2017-03-05 DIAGNOSIS — I10 Essential (primary) hypertension: Secondary | ICD-10-CM | POA: Diagnosis not present

## 2017-03-05 DIAGNOSIS — E875 Hyperkalemia: Secondary | ICD-10-CM | POA: Diagnosis not present

## 2017-03-05 DIAGNOSIS — E119 Type 2 diabetes mellitus without complications: Secondary | ICD-10-CM | POA: Diagnosis not present

## 2017-03-18 DIAGNOSIS — I10 Essential (primary) hypertension: Secondary | ICD-10-CM | POA: Diagnosis not present

## 2017-03-23 DIAGNOSIS — Z23 Encounter for immunization: Secondary | ICD-10-CM | POA: Diagnosis not present

## 2017-03-23 DIAGNOSIS — I10 Essential (primary) hypertension: Secondary | ICD-10-CM | POA: Diagnosis not present

## 2017-03-23 DIAGNOSIS — R739 Hyperglycemia, unspecified: Secondary | ICD-10-CM | POA: Diagnosis not present

## 2017-03-23 DIAGNOSIS — E875 Hyperkalemia: Secondary | ICD-10-CM | POA: Diagnosis not present

## 2017-06-26 DIAGNOSIS — I10 Essential (primary) hypertension: Secondary | ICD-10-CM | POA: Diagnosis not present

## 2017-06-26 DIAGNOSIS — E875 Hyperkalemia: Secondary | ICD-10-CM | POA: Diagnosis not present

## 2017-06-26 DIAGNOSIS — R739 Hyperglycemia, unspecified: Secondary | ICD-10-CM | POA: Diagnosis not present

## 2017-07-06 DIAGNOSIS — I10 Essential (primary) hypertension: Secondary | ICD-10-CM | POA: Diagnosis not present

## 2017-07-06 DIAGNOSIS — E119 Type 2 diabetes mellitus without complications: Secondary | ICD-10-CM | POA: Diagnosis not present

## 2017-07-06 DIAGNOSIS — E7889 Other lipoprotein metabolism disorders: Secondary | ICD-10-CM | POA: Diagnosis not present

## 2017-09-30 DIAGNOSIS — E119 Type 2 diabetes mellitus without complications: Secondary | ICD-10-CM | POA: Diagnosis not present

## 2017-09-30 DIAGNOSIS — I10 Essential (primary) hypertension: Secondary | ICD-10-CM | POA: Diagnosis not present

## 2017-10-07 DIAGNOSIS — E119 Type 2 diabetes mellitus without complications: Secondary | ICD-10-CM | POA: Diagnosis not present

## 2017-10-07 DIAGNOSIS — E7800 Pure hypercholesterolemia, unspecified: Secondary | ICD-10-CM | POA: Diagnosis not present

## 2017-10-07 DIAGNOSIS — I10 Essential (primary) hypertension: Secondary | ICD-10-CM | POA: Diagnosis not present

## 2018-01-06 DIAGNOSIS — E119 Type 2 diabetes mellitus without complications: Secondary | ICD-10-CM | POA: Diagnosis not present

## 2018-01-06 DIAGNOSIS — I10 Essential (primary) hypertension: Secondary | ICD-10-CM | POA: Diagnosis not present

## 2018-01-06 DIAGNOSIS — E7800 Pure hypercholesterolemia, unspecified: Secondary | ICD-10-CM | POA: Diagnosis not present

## 2018-02-03 DIAGNOSIS — N183 Chronic kidney disease, stage 3 (moderate): Secondary | ICD-10-CM | POA: Diagnosis not present

## 2018-02-03 DIAGNOSIS — I10 Essential (primary) hypertension: Secondary | ICD-10-CM | POA: Diagnosis not present

## 2018-02-03 DIAGNOSIS — E875 Hyperkalemia: Secondary | ICD-10-CM | POA: Diagnosis not present

## 2018-02-03 DIAGNOSIS — E785 Hyperlipidemia, unspecified: Secondary | ICD-10-CM | POA: Diagnosis not present

## 2018-02-03 DIAGNOSIS — E119 Type 2 diabetes mellitus without complications: Secondary | ICD-10-CM | POA: Diagnosis not present

## 2018-02-03 DIAGNOSIS — Z0000 Encounter for general adult medical examination without abnormal findings: Secondary | ICD-10-CM | POA: Diagnosis not present

## 2018-02-15 DIAGNOSIS — I10 Essential (primary) hypertension: Secondary | ICD-10-CM | POA: Diagnosis not present

## 2018-02-15 DIAGNOSIS — D649 Anemia, unspecified: Secondary | ICD-10-CM | POA: Diagnosis not present

## 2018-02-17 DIAGNOSIS — E7889 Other lipoprotein metabolism disorders: Secondary | ICD-10-CM | POA: Diagnosis not present

## 2018-02-17 DIAGNOSIS — E875 Hyperkalemia: Secondary | ICD-10-CM | POA: Diagnosis not present

## 2018-02-17 DIAGNOSIS — E119 Type 2 diabetes mellitus without complications: Secondary | ICD-10-CM | POA: Diagnosis not present

## 2018-02-17 DIAGNOSIS — I10 Essential (primary) hypertension: Secondary | ICD-10-CM | POA: Diagnosis not present

## 2018-03-23 DIAGNOSIS — Z23 Encounter for immunization: Secondary | ICD-10-CM | POA: Diagnosis not present

## 2018-03-23 DIAGNOSIS — I10 Essential (primary) hypertension: Secondary | ICD-10-CM | POA: Diagnosis not present

## 2018-04-20 ENCOUNTER — Other Ambulatory Visit: Payer: Self-pay

## 2018-04-20 NOTE — Patient Outreach (Signed)
[facility]) Care Management  04/20/2018  Elyon Zoll 09/16/1920 [id number]   Medication Adherence call to Mr. Reginold Beale left a message for patient to call back patient is due on Losartan 25 mg.Mr. Mester is showing past due under [facility] Ins.   Lillia Abed CPhT Pharmacy Technician [facility] Care Management Direct Dial [phone]  Fax [phone] [email or web address]

## 2018-04-21 DIAGNOSIS — I10 Essential (primary) hypertension: Secondary | ICD-10-CM | POA: Diagnosis not present

## 2018-07-13 DIAGNOSIS — E119 Type 2 diabetes mellitus without complications: Secondary | ICD-10-CM | POA: Diagnosis not present

## 2018-07-13 DIAGNOSIS — Z0000 Encounter for general adult medical examination without abnormal findings: Secondary | ICD-10-CM | POA: Diagnosis not present

## 2018-07-13 DIAGNOSIS — I10 Essential (primary) hypertension: Secondary | ICD-10-CM | POA: Diagnosis not present

## 2018-07-22 DIAGNOSIS — N183 Chronic kidney disease, stage 3 (moderate): Secondary | ICD-10-CM | POA: Diagnosis not present

## 2018-07-22 DIAGNOSIS — E7800 Pure hypercholesterolemia, unspecified: Secondary | ICD-10-CM | POA: Diagnosis not present

## 2018-07-22 DIAGNOSIS — I10 Essential (primary) hypertension: Secondary | ICD-10-CM | POA: Diagnosis not present

## 2018-07-22 DIAGNOSIS — E119 Type 2 diabetes mellitus without complications: Secondary | ICD-10-CM | POA: Diagnosis not present

## 2018-11-01 DIAGNOSIS — Z0000 Encounter for general adult medical examination without abnormal findings: Secondary | ICD-10-CM | POA: Diagnosis not present

## 2018-11-01 DIAGNOSIS — E7800 Pure hypercholesterolemia, unspecified: Secondary | ICD-10-CM | POA: Diagnosis not present

## 2018-11-01 DIAGNOSIS — E119 Type 2 diabetes mellitus without complications: Secondary | ICD-10-CM | POA: Diagnosis not present

## 2018-11-01 DIAGNOSIS — N183 Chronic kidney disease, stage 3 (moderate): Secondary | ICD-10-CM | POA: Diagnosis not present

## 2018-11-01 DIAGNOSIS — I10 Essential (primary) hypertension: Secondary | ICD-10-CM | POA: Diagnosis not present

## 2018-11-09 DIAGNOSIS — N183 Chronic kidney disease, stage 3 (moderate): Secondary | ICD-10-CM | POA: Diagnosis not present

## 2018-11-09 DIAGNOSIS — Z0001 Encounter for general adult medical examination with abnormal findings: Secondary | ICD-10-CM | POA: Diagnosis not present

## 2018-11-09 DIAGNOSIS — I10 Essential (primary) hypertension: Secondary | ICD-10-CM | POA: Diagnosis not present

## 2018-11-09 DIAGNOSIS — E119 Type 2 diabetes mellitus without complications: Secondary | ICD-10-CM | POA: Diagnosis not present

## 2018-11-09 DIAGNOSIS — E7800 Pure hypercholesterolemia, unspecified: Secondary | ICD-10-CM | POA: Diagnosis not present

## 2018-11-20 DIAGNOSIS — I129 Hypertensive chronic kidney disease with stage 1 through stage 4 chronic kidney disease, or unspecified chronic kidney disease: Secondary | ICD-10-CM | POA: Diagnosis not present

## 2018-11-20 DIAGNOSIS — Z7984 Long term (current) use of oral hypoglycemic drugs: Secondary | ICD-10-CM | POA: Diagnosis not present

## 2018-11-20 DIAGNOSIS — R2681 Unsteadiness on feet: Secondary | ICD-10-CM | POA: Diagnosis not present

## 2018-11-20 DIAGNOSIS — E1122 Type 2 diabetes mellitus with diabetic chronic kidney disease: Secondary | ICD-10-CM | POA: Diagnosis not present

## 2018-11-20 DIAGNOSIS — E7800 Pure hypercholesterolemia, unspecified: Secondary | ICD-10-CM | POA: Diagnosis not present

## 2018-11-20 DIAGNOSIS — Z87891 Personal history of nicotine dependence: Secondary | ICD-10-CM | POA: Diagnosis not present

## 2018-11-20 DIAGNOSIS — R5383 Other fatigue: Secondary | ICD-10-CM | POA: Diagnosis not present

## 2018-11-20 DIAGNOSIS — E875 Hyperkalemia: Secondary | ICD-10-CM | POA: Diagnosis not present

## 2018-11-20 DIAGNOSIS — N183 Chronic kidney disease, stage 3 (moderate): Secondary | ICD-10-CM | POA: Diagnosis not present

## 2018-11-20 DIAGNOSIS — Z9181 History of falling: Secondary | ICD-10-CM | POA: Diagnosis not present

## 2018-11-23 DIAGNOSIS — E7800 Pure hypercholesterolemia, unspecified: Secondary | ICD-10-CM | POA: Diagnosis not present

## 2018-11-23 DIAGNOSIS — E1122 Type 2 diabetes mellitus with diabetic chronic kidney disease: Secondary | ICD-10-CM | POA: Diagnosis not present

## 2018-11-23 DIAGNOSIS — Z7984 Long term (current) use of oral hypoglycemic drugs: Secondary | ICD-10-CM | POA: Diagnosis not present

## 2018-11-23 DIAGNOSIS — R5383 Other fatigue: Secondary | ICD-10-CM | POA: Diagnosis not present

## 2018-11-23 DIAGNOSIS — Z87891 Personal history of nicotine dependence: Secondary | ICD-10-CM | POA: Diagnosis not present

## 2018-11-23 DIAGNOSIS — N183 Chronic kidney disease, stage 3 (moderate): Secondary | ICD-10-CM | POA: Diagnosis not present

## 2018-11-23 DIAGNOSIS — R2681 Unsteadiness on feet: Secondary | ICD-10-CM | POA: Diagnosis not present

## 2018-11-23 DIAGNOSIS — Z9181 History of falling: Secondary | ICD-10-CM | POA: Diagnosis not present

## 2018-11-23 DIAGNOSIS — E875 Hyperkalemia: Secondary | ICD-10-CM | POA: Diagnosis not present

## 2018-11-23 DIAGNOSIS — I129 Hypertensive chronic kidney disease with stage 1 through stage 4 chronic kidney disease, or unspecified chronic kidney disease: Secondary | ICD-10-CM | POA: Diagnosis not present

## 2018-11-24 DIAGNOSIS — E875 Hyperkalemia: Secondary | ICD-10-CM | POA: Diagnosis not present

## 2018-11-24 DIAGNOSIS — Z7984 Long term (current) use of oral hypoglycemic drugs: Secondary | ICD-10-CM | POA: Diagnosis not present

## 2018-11-24 DIAGNOSIS — Z87891 Personal history of nicotine dependence: Secondary | ICD-10-CM | POA: Diagnosis not present

## 2018-11-24 DIAGNOSIS — I129 Hypertensive chronic kidney disease with stage 1 through stage 4 chronic kidney disease, or unspecified chronic kidney disease: Secondary | ICD-10-CM | POA: Diagnosis not present

## 2018-11-24 DIAGNOSIS — Z9181 History of falling: Secondary | ICD-10-CM | POA: Diagnosis not present

## 2018-11-24 DIAGNOSIS — R5383 Other fatigue: Secondary | ICD-10-CM | POA: Diagnosis not present

## 2018-11-24 DIAGNOSIS — E1122 Type 2 diabetes mellitus with diabetic chronic kidney disease: Secondary | ICD-10-CM | POA: Diagnosis not present

## 2018-11-24 DIAGNOSIS — E7800 Pure hypercholesterolemia, unspecified: Secondary | ICD-10-CM | POA: Diagnosis not present

## 2018-11-24 DIAGNOSIS — N183 Chronic kidney disease, stage 3 (moderate): Secondary | ICD-10-CM | POA: Diagnosis not present

## 2018-11-24 DIAGNOSIS — R2681 Unsteadiness on feet: Secondary | ICD-10-CM | POA: Diagnosis not present

## 2018-11-26 DIAGNOSIS — Z9181 History of falling: Secondary | ICD-10-CM | POA: Diagnosis not present

## 2018-11-26 DIAGNOSIS — R2681 Unsteadiness on feet: Secondary | ICD-10-CM | POA: Diagnosis not present

## 2018-11-26 DIAGNOSIS — Z87891 Personal history of nicotine dependence: Secondary | ICD-10-CM | POA: Diagnosis not present

## 2018-11-26 DIAGNOSIS — E7800 Pure hypercholesterolemia, unspecified: Secondary | ICD-10-CM | POA: Diagnosis not present

## 2018-11-26 DIAGNOSIS — I129 Hypertensive chronic kidney disease with stage 1 through stage 4 chronic kidney disease, or unspecified chronic kidney disease: Secondary | ICD-10-CM | POA: Diagnosis not present

## 2018-11-26 DIAGNOSIS — E1122 Type 2 diabetes mellitus with diabetic chronic kidney disease: Secondary | ICD-10-CM | POA: Diagnosis not present

## 2018-11-26 DIAGNOSIS — R5383 Other fatigue: Secondary | ICD-10-CM | POA: Diagnosis not present

## 2018-11-26 DIAGNOSIS — E875 Hyperkalemia: Secondary | ICD-10-CM | POA: Diagnosis not present

## 2018-11-26 DIAGNOSIS — Z7984 Long term (current) use of oral hypoglycemic drugs: Secondary | ICD-10-CM | POA: Diagnosis not present

## 2018-11-26 DIAGNOSIS — N183 Chronic kidney disease, stage 3 (moderate): Secondary | ICD-10-CM | POA: Diagnosis not present

## 2018-11-29 DIAGNOSIS — R5383 Other fatigue: Secondary | ICD-10-CM | POA: Diagnosis not present

## 2018-11-29 DIAGNOSIS — I129 Hypertensive chronic kidney disease with stage 1 through stage 4 chronic kidney disease, or unspecified chronic kidney disease: Secondary | ICD-10-CM | POA: Diagnosis not present

## 2018-11-29 DIAGNOSIS — Z9181 History of falling: Secondary | ICD-10-CM | POA: Diagnosis not present

## 2018-11-29 DIAGNOSIS — E875 Hyperkalemia: Secondary | ICD-10-CM | POA: Diagnosis not present

## 2018-11-29 DIAGNOSIS — R2681 Unsteadiness on feet: Secondary | ICD-10-CM | POA: Diagnosis not present

## 2018-11-29 DIAGNOSIS — E7800 Pure hypercholesterolemia, unspecified: Secondary | ICD-10-CM | POA: Diagnosis not present

## 2018-11-29 DIAGNOSIS — E1122 Type 2 diabetes mellitus with diabetic chronic kidney disease: Secondary | ICD-10-CM | POA: Diagnosis not present

## 2018-11-29 DIAGNOSIS — Z87891 Personal history of nicotine dependence: Secondary | ICD-10-CM | POA: Diagnosis not present

## 2018-11-29 DIAGNOSIS — N183 Chronic kidney disease, stage 3 (moderate): Secondary | ICD-10-CM | POA: Diagnosis not present

## 2018-11-29 DIAGNOSIS — Z7984 Long term (current) use of oral hypoglycemic drugs: Secondary | ICD-10-CM | POA: Diagnosis not present

## 2018-11-30 DIAGNOSIS — N183 Chronic kidney disease, stage 3 (moderate): Secondary | ICD-10-CM | POA: Diagnosis not present

## 2018-11-30 DIAGNOSIS — E1122 Type 2 diabetes mellitus with diabetic chronic kidney disease: Secondary | ICD-10-CM | POA: Diagnosis not present

## 2018-11-30 DIAGNOSIS — R2681 Unsteadiness on feet: Secondary | ICD-10-CM | POA: Diagnosis not present

## 2018-11-30 DIAGNOSIS — I129 Hypertensive chronic kidney disease with stage 1 through stage 4 chronic kidney disease, or unspecified chronic kidney disease: Secondary | ICD-10-CM | POA: Diagnosis not present

## 2018-12-01 DIAGNOSIS — Z7984 Long term (current) use of oral hypoglycemic drugs: Secondary | ICD-10-CM | POA: Diagnosis not present

## 2018-12-01 DIAGNOSIS — E7800 Pure hypercholesterolemia, unspecified: Secondary | ICD-10-CM | POA: Diagnosis not present

## 2018-12-01 DIAGNOSIS — I129 Hypertensive chronic kidney disease with stage 1 through stage 4 chronic kidney disease, or unspecified chronic kidney disease: Secondary | ICD-10-CM | POA: Diagnosis not present

## 2018-12-01 DIAGNOSIS — E875 Hyperkalemia: Secondary | ICD-10-CM | POA: Diagnosis not present

## 2018-12-01 DIAGNOSIS — Z9181 History of falling: Secondary | ICD-10-CM | POA: Diagnosis not present

## 2018-12-01 DIAGNOSIS — N183 Chronic kidney disease, stage 3 (moderate): Secondary | ICD-10-CM | POA: Diagnosis not present

## 2018-12-01 DIAGNOSIS — R2681 Unsteadiness on feet: Secondary | ICD-10-CM | POA: Diagnosis not present

## 2018-12-01 DIAGNOSIS — E1122 Type 2 diabetes mellitus with diabetic chronic kidney disease: Secondary | ICD-10-CM | POA: Diagnosis not present

## 2018-12-01 DIAGNOSIS — R5383 Other fatigue: Secondary | ICD-10-CM | POA: Diagnosis not present

## 2018-12-01 DIAGNOSIS — Z87891 Personal history of nicotine dependence: Secondary | ICD-10-CM | POA: Diagnosis not present

## 2018-12-02 DIAGNOSIS — E1122 Type 2 diabetes mellitus with diabetic chronic kidney disease: Secondary | ICD-10-CM | POA: Diagnosis not present

## 2018-12-02 DIAGNOSIS — E875 Hyperkalemia: Secondary | ICD-10-CM | POA: Diagnosis not present

## 2018-12-02 DIAGNOSIS — Z87891 Personal history of nicotine dependence: Secondary | ICD-10-CM | POA: Diagnosis not present

## 2018-12-02 DIAGNOSIS — R2681 Unsteadiness on feet: Secondary | ICD-10-CM | POA: Diagnosis not present

## 2018-12-02 DIAGNOSIS — I129 Hypertensive chronic kidney disease with stage 1 through stage 4 chronic kidney disease, or unspecified chronic kidney disease: Secondary | ICD-10-CM | POA: Diagnosis not present

## 2018-12-02 DIAGNOSIS — Z7984 Long term (current) use of oral hypoglycemic drugs: Secondary | ICD-10-CM | POA: Diagnosis not present

## 2018-12-02 DIAGNOSIS — Z9181 History of falling: Secondary | ICD-10-CM | POA: Diagnosis not present

## 2018-12-02 DIAGNOSIS — N183 Chronic kidney disease, stage 3 (moderate): Secondary | ICD-10-CM | POA: Diagnosis not present

## 2018-12-02 DIAGNOSIS — E7800 Pure hypercholesterolemia, unspecified: Secondary | ICD-10-CM | POA: Diagnosis not present

## 2018-12-02 DIAGNOSIS — R5383 Other fatigue: Secondary | ICD-10-CM | POA: Diagnosis not present

## 2018-12-03 ENCOUNTER — Emergency Department (HOSPITAL_COMMUNITY): Payer: Medicare Other

## 2018-12-03 ENCOUNTER — Encounter (HOSPITAL_COMMUNITY)
Admission: EM | Disposition: A | Discharge status: Home Health | Payer: Self-pay | Source: Home / Self Care | Attending: Internal Medicine

## 2018-12-03 ENCOUNTER — Encounter (HOSPITAL_COMMUNITY): Payer: Self-pay | Admitting: Emergency Medicine

## 2018-12-03 ENCOUNTER — Inpatient Hospital Stay (HOSPITAL_COMMUNITY)
Admission: EM | Admit: 2018-12-03 | Discharge: 2018-12-05 | DRG: 689 | Disposition: A | Discharge status: Home Health | Payer: Medicare Other | Attending: Internal Medicine | Admitting: Internal Medicine

## 2018-12-03 ENCOUNTER — Other Ambulatory Visit: Payer: Self-pay

## 2018-12-03 DIAGNOSIS — E11649 Type 2 diabetes mellitus with hypoglycemia without coma: Secondary | ICD-10-CM | POA: Diagnosis present

## 2018-12-03 DIAGNOSIS — Z7982 Long term (current) use of aspirin: Secondary | ICD-10-CM

## 2018-12-03 DIAGNOSIS — I483 Typical atrial flutter: Secondary | ICD-10-CM | POA: Diagnosis not present

## 2018-12-03 DIAGNOSIS — I442 Atrioventricular block, complete: Secondary | ICD-10-CM

## 2018-12-03 DIAGNOSIS — E860 Dehydration: Secondary | ICD-10-CM | POA: Diagnosis not present

## 2018-12-03 DIAGNOSIS — I959 Hypotension, unspecified: Secondary | ICD-10-CM | POA: Diagnosis not present

## 2018-12-03 DIAGNOSIS — R54 Age-related physical debility: Secondary | ICD-10-CM

## 2018-12-03 DIAGNOSIS — I10 Essential (primary) hypertension: Secondary | ICD-10-CM | POA: Diagnosis not present

## 2018-12-03 DIAGNOSIS — I129 Hypertensive chronic kidney disease with stage 1 through stage 4 chronic kidney disease, or unspecified chronic kidney disease: Secondary | ICD-10-CM | POA: Diagnosis not present

## 2018-12-03 DIAGNOSIS — I459 Conduction disorder, unspecified: Secondary | ICD-10-CM | POA: Diagnosis not present

## 2018-12-03 DIAGNOSIS — E162 Hypoglycemia, unspecified: Secondary | ICD-10-CM

## 2018-12-03 DIAGNOSIS — Z87891 Personal history of nicotine dependence: Secondary | ICD-10-CM

## 2018-12-03 DIAGNOSIS — E119 Type 2 diabetes mellitus without complications: Secondary | ICD-10-CM

## 2018-12-03 DIAGNOSIS — G9341 Metabolic encephalopathy: Secondary | ICD-10-CM | POA: Diagnosis present

## 2018-12-03 DIAGNOSIS — I4891 Unspecified atrial fibrillation: Secondary | ICD-10-CM | POA: Diagnosis not present

## 2018-12-03 DIAGNOSIS — E1122 Type 2 diabetes mellitus with diabetic chronic kidney disease: Secondary | ICD-10-CM | POA: Diagnosis not present

## 2018-12-03 DIAGNOSIS — Z7984 Long term (current) use of oral hypoglycemic drugs: Secondary | ICD-10-CM

## 2018-12-03 DIAGNOSIS — F0391 Unspecified dementia with behavioral disturbance: Secondary | ICD-10-CM | POA: Diagnosis present

## 2018-12-03 DIAGNOSIS — G9340 Encephalopathy, unspecified: Secondary | ICD-10-CM | POA: Diagnosis present

## 2018-12-03 DIAGNOSIS — H9190 Unspecified hearing loss, unspecified ear: Secondary | ICD-10-CM | POA: Diagnosis not present

## 2018-12-03 DIAGNOSIS — I4892 Unspecified atrial flutter: Secondary | ICD-10-CM | POA: Diagnosis not present

## 2018-12-03 DIAGNOSIS — N390 Urinary tract infection, site not specified: Secondary | ICD-10-CM | POA: Diagnosis not present

## 2018-12-03 DIAGNOSIS — E785 Hyperlipidemia, unspecified: Secondary | ICD-10-CM | POA: Diagnosis present

## 2018-12-03 DIAGNOSIS — N3000 Acute cystitis without hematuria: Secondary | ICD-10-CM | POA: Diagnosis not present

## 2018-12-03 DIAGNOSIS — R5381 Other malaise: Secondary | ICD-10-CM | POA: Diagnosis not present

## 2018-12-03 DIAGNOSIS — R4020 Unspecified coma: Secondary | ICD-10-CM | POA: Diagnosis not present

## 2018-12-03 DIAGNOSIS — R001 Bradycardia, unspecified: Secondary | ICD-10-CM | POA: Diagnosis not present

## 2018-12-03 DIAGNOSIS — N183 Chronic kidney disease, stage 3 unspecified: Secondary | ICD-10-CM

## 2018-12-03 DIAGNOSIS — R531 Weakness: Secondary | ICD-10-CM | POA: Diagnosis not present

## 2018-12-03 DIAGNOSIS — Z20828 Contact with and (suspected) exposure to other viral communicable diseases: Secondary | ICD-10-CM | POA: Diagnosis present

## 2018-12-03 DIAGNOSIS — J9811 Atelectasis: Secondary | ICD-10-CM | POA: Diagnosis not present

## 2018-12-03 DIAGNOSIS — E875 Hyperkalemia: Secondary | ICD-10-CM | POA: Diagnosis not present

## 2018-12-03 LAB — SARS CORONAVIRUS 2 BY RT PCR (HOSPITAL ORDER, PERFORMED IN ~~LOC~~ HOSPITAL LAB): SARS Coronavirus 2: NEGATIVE

## 2018-12-03 LAB — CBC WITH DIFFERENTIAL/PLATELET
Abs Immature Granulocytes: 0.01 10*3/uL (ref 0.00–0.07)
Basophils Absolute: 0 10*3/uL (ref 0.0–0.1)
Basophils Relative: 1 %
Eosinophils Absolute: 0.2 10*3/uL (ref 0.0–0.5)
Eosinophils Relative: 4 %
HCT: 39.7 % (ref 39.0–52.0)
Hemoglobin: 11.8 g/dL — ABNORMAL LOW (ref 13.0–17.0)
Immature Granulocytes: 0 %
Lymphocytes Relative: 28 %
Lymphs Abs: 1.8 10*3/uL (ref 0.7–4.0)
MCH: 28 pg (ref 26.0–34.0)
MCHC: 29.7 g/dL — ABNORMAL LOW (ref 30.0–36.0)
MCV: 94.3 fL (ref 80.0–100.0)
Monocytes Absolute: 0.6 10*3/uL (ref 0.1–1.0)
Monocytes Relative: 9 %
Neutro Abs: 3.7 10*3/uL (ref 1.7–7.7)
Neutrophils Relative %: 58 %
Platelets: 251 10*3/uL (ref 150–400)
RBC: 4.21 MIL/uL — ABNORMAL LOW (ref 4.22–5.81)
RDW: 14.5 % (ref 11.5–15.5)
WBC: 6.4 10*3/uL (ref 4.0–10.5)
nRBC: 0 % (ref 0.0–0.2)

## 2018-12-03 LAB — URINALYSIS, ROUTINE W REFLEX MICROSCOPIC
Bacteria, UA: NONE SEEN
Bilirubin Urine: NEGATIVE
Glucose, UA: NEGATIVE mg/dL
Hgb urine dipstick: NEGATIVE
Ketones, ur: NEGATIVE mg/dL
Nitrite: POSITIVE — AB
Protein, ur: NEGATIVE mg/dL
Specific Gravity, Urine: 1.009 (ref 1.005–1.030)
pH: 7 (ref 5.0–8.0)

## 2018-12-03 LAB — TROPONIN I
Troponin I: <0.03 ng/mL (ref <0.03)
Troponin I: <0.03 ng/mL (ref <0.03)

## 2018-12-03 LAB — PROTIME-INR
INR: 1.1 (ref 0.8–1.2)
Prothrombin Time: 14.1 seconds (ref 11.4–15.2)

## 2018-12-03 LAB — BASIC METABOLIC PANEL
Anion gap: 11 (ref 5–15)
BUN: 25 mg/dL — ABNORMAL HIGH (ref 8–23)
CO2: 23 mmol/L (ref 22–32)
Calcium: 9.6 mg/dL (ref 8.9–10.3)
Chloride: 108 mmol/L (ref 98–111)
Creatinine, Ser: 1.48 mg/dL — ABNORMAL HIGH (ref 0.61–1.24)
GFR calc Af Amer: 46 mL/min — ABNORMAL LOW (ref >60)
GFR calc non Af Amer: 40 mL/min — ABNORMAL LOW (ref >60)
Glucose, Bld: 93 mg/dL (ref 70–99)
Potassium: 5.3 mmol/L — ABNORMAL HIGH (ref 3.5–5.1)
Sodium: 142 mmol/L (ref 135–145)

## 2018-12-03 LAB — GLUCOSE, CAPILLARY: Glucose-Capillary: 103 mg/dL — ABNORMAL HIGH (ref 70–99)

## 2018-12-03 SURGERY — PACEMAKER IMPLANT
Anesthesia: LOCAL

## 2018-12-03 MED ORDER — ASPIRIN 81 MG PO CHEW
81.0000 mg | CHEWABLE_TABLET | Freq: Every evening | ORAL | Status: DC
  Administered 2018-12-03 – 2018-12-04 (×2): 81 mg via ORAL
  Filled 2018-12-03 (×2): qty 1

## 2018-12-03 MED ORDER — ACETAMINOPHEN 650 MG RE SUPP: 650.0000 mg | Freq: Four times a day (QID) | RECTAL | Status: DC | PRN

## 2018-12-03 MED ORDER — SODIUM CHLORIDE 0.9 % IV SOLN
1.0000 g | Freq: Once | INTRAVENOUS | Status: AC
  Administered 2018-12-03: 1 g via INTRAVENOUS
  Filled 2018-12-03: qty 10

## 2018-12-03 MED ORDER — INSULIN ASPART 100 UNIT/ML ~~LOC~~ SOLN
0.0000 [IU] | Freq: Three times a day (TID) | SUBCUTANEOUS | Status: DC
  Administered 2018-12-04 – 2018-12-05 (×2): 2 [IU] via SUBCUTANEOUS

## 2018-12-03 MED ORDER — HEPARIN SODIUM (PORCINE) 5000 UNIT/ML IJ SOLN
5000.0000 [IU] | Freq: Three times a day (TID) | INTRAMUSCULAR | Status: DC
  Administered 2018-12-03 – 2018-12-05 (×5): 5000 [IU] via SUBCUTANEOUS
  Filled 2018-12-03 (×5): qty 1

## 2018-12-03 MED ORDER — SODIUM CHLORIDE 0.9 % IV BOLUS
500.0000 mL | Freq: Once | INTRAVENOUS | Status: AC
  Administered 2018-12-03: 500 mL via INTRAVENOUS

## 2018-12-03 MED ORDER — SODIUM CHLORIDE 0.9 % IV SOLN
1.0000 g | INTRAVENOUS | Status: DC
  Administered 2018-12-04 – 2018-12-05 (×2): 1 g via INTRAVENOUS
  Filled 2018-12-03 (×3): qty 10

## 2018-12-03 MED ORDER — HYDRALAZINE HCL 25 MG PO TABS
25.0000 mg | ORAL_TABLET | Freq: Two times a day (BID) | ORAL | Status: DC
  Administered 2018-12-03 – 2018-12-05 (×3): 25 mg via ORAL
  Filled 2018-12-03 (×3): qty 1

## 2018-12-03 MED ORDER — SODIUM CHLORIDE 0.9% FLUSH
3.0000 mL | Freq: Two times a day (BID) | INTRAVENOUS | Status: DC
  Administered 2018-12-03 – 2018-12-05 (×4): 3 mL via INTRAVENOUS

## 2018-12-03 MED ORDER — ACETAMINOPHEN 325 MG PO TABS: 650.0000 mg | ORAL_TABLET | Freq: Four times a day (QID) | ORAL | Status: DC | PRN

## 2018-12-03 MED ORDER — PRAVASTATIN SODIUM 40 MG PO TABS
80.0000 mg | ORAL_TABLET | Freq: Every day | ORAL | Status: DC
  Administered 2018-12-04 – 2018-12-05 (×2): 80 mg via ORAL
  Filled 2018-12-03 (×2): qty 2

## 2018-12-03 NOTE — ED Notes (Signed)
ED TO INPATIENT HANDOFF REPORT  ED Nurse Name and Phone #: Sherrilyn Rist [phone]  S Name/Age/Gender Alexander Powell 85 y.o. male Room/Bed: 022C/022C  Code Status   Code Status: Full Code  Home/SNF/Other Home Patient oriented to: self Is this baseline? Yes   Triage Complete: Triage complete  Chief Complaint bradycardia  Triage Note Pt BIB [facility] from Dr.'s office. Pt was seen there today for bradycardia. Per EMS pt has been failing to thrive for the last month and this week it has gotten worse. Pt ambulates with assistance at home. Pt is oriented to self and this is his baseline.  158/58 CBG 140 89F 100% RA HR 40   Allergies No Known Allergies  Level of Care/Admitting Diagnosis ED Disposition    ED Disposition Condition Comment   Admit  Hospital Area: [facility] [[id number]]  Level of Care: Progressive [102]  I expect the patient will be discharged within 24 hours: No (not a candidate for 5C-Observation unit)  Covid Evaluation: Confirmed COVID Negative  Diagnosis: Bradycardia [454098]  Admitting Physician: Charlsie Quest [[id number]]  Attending Physician: Charlsie Quest [[id number]]  PT Class (Do Not Modify): Observation [104]  PT Acc Code (Do Not Modify): Observation [10022]       B Medical/Surgery History Past Medical History:  Diagnosis Date  . DM (diabetes mellitus), type 2 (HCC)   . Hearing loss    pt wear hearing aid  . History of stress test 05/2006   Showed a mild to moderate perfusion defect in the apical wall with no reversibility. He also had a mild to moderate perfusion defect in the mid inferior wall with no reversibilty, and a mild to moderate perfusion defect in the apicl inferior wall with no reversibility. There is thought thatthis could be potentially due to infarct or scar, but not corroborated with his echo.   Marland Kitchen Hx of echocardiogram 09/2010   Normal LV size with proximal septal thickening, but normal EF at greater than 55% with mild  impaired relaxation. Mildly sclerotic, but not stenotic, aortic valve with aortic sclerosis noted, but no other significant lesions or abnormalities, just some left atrial calcification.  . Hyperlipidemia   . Hypertension    Past Surgical History:  Procedure Laterality Date  . BACK SURGERY  1995  . CHOLECYSTECTOMY N/A 04/14/2015   Procedure: LAPAROSCOPIC CHOLECYSTECTOMY;  Surgeon: Glenna Fellows, MD;  Location: [facility];  Service: General;  Laterality: N/A;     A IV Location/Drains/Wounds Patient Lines/Drains/Airways Status   Active Line/Drains/Airways    Name:   Placement date:   Placement time:   Site:   Days:   Peripheral IV 12/03/18 Right Antecubital   12/03/18    2017    Antecubital   less than 1   Closed System Drain 1 Right Abdomen Bulb (JP) 19 Fr.   04/14/15    1105    Abdomen   1329   Incision (Closed) 04/14/15 Abdomen Other (Comment)   04/14/15    1122     1329   Incision - 4 Ports Abdomen 1: Right 2: Superior 3: Left 4: Umbilicus   04/14/15    1031     1329          Intake/Output Last 24 hours  Intake/Output Summary (Last 24 hours) at 12/03/2018 2227 Last data filed at 12/03/2018 2222 Gross per 24 hour  Intake 600 ml  Output -  Net 600 ml    Labs/Imaging Results for orders placed or performed during the  hospital encounter of 12/03/18 (from the past 48 hour(s))  Basic metabolic panel     Status: Abnormal   Collection Time: 12/03/18  1:15 PM  Result Value Ref Range   Sodium 142 135 - 145 mmol/L   Potassium 5.3 (H) 3.5 - 5.1 mmol/L   Chloride 108 98 - 111 mmol/L   CO2 23 22 - 32 mmol/L   Glucose, Bld 93 70 - 99 mg/dL   BUN 25 (H) 8 - 23 mg/dL   Creatinine, Ser 1.61 (H) 0.61 - 1.24 mg/dL   Calcium 9.6 8.9 - 09.6 mg/dL   GFR calc non Af Amer 40 (L) >60 mL/min   GFR calc Af Amer 46 (L) >60 mL/min   Anion gap 11 5 - 15    Comment: Performed at [facility], [address], [place], [place] [address]  Troponin I - Once     Status: None   Collection Time:  12/03/18  1:15 PM  Result Value Ref Range   Troponin I <0.03 <0.03 ng/mL    Comment: Performed at [facility], [address], [place], [place] [address]  Protime-INR     Status: None   Collection Time: 12/03/18  1:15 PM  Result Value Ref Range   Prothrombin Time 14.1 11.4 - 15.2 seconds   INR 1.1 0.8 - 1.2    Comment: (NOTE) INR goal varies based on device and disease states. Performed at [facility], [address] [place], [place] [address]   CBC with Differential     Status: Abnormal   Collection Time: 12/03/18  1:15 PM  Result Value Ref Range   WBC 6.4 4.0 - 10.5 K/uL   RBC 4.21 (L) 4.22 - 5.81 MIL/uL   Hemoglobin 11.8 (L) 13.0 - 17.0 g/dL   HCT 95.6 21.3 - 08.6 %   MCV 94.3 80.0 - 100.0 fL   MCH 28.0 26.0 - 34.0 pg   MCHC 29.7 (L) 30.0 - 36.0 g/dL   RDW 57.8 46.9 - 62.9 %   Platelets 251 150 - 400 K/uL   nRBC 0.0 0.0 - 0.2 %   Neutrophils Relative % 58 %   Neutro Abs 3.7 1.7 - 7.7 K/uL   Lymphocytes Relative 28 %   Lymphs Abs 1.8 0.7 - 4.0 K/uL   Monocytes Relative 9 %   Monocytes Absolute 0.6 0.1 - 1.0 K/uL   Eosinophils Relative 4 %   Eosinophils Absolute 0.2 0.0 - 0.5 K/uL   Basophils Relative 1 %   Basophils Absolute 0.0 0.0 - 0.1 K/uL   Immature Granulocytes 0 %   Abs Immature Granulocytes 0.01 0.00 - 0.07 K/uL    Comment: Performed at [facility], [address] [place], [place] [address]  SARS Coronavirus 2 (CEPHEID - Performed in [facility] hospital lab), Hosp Order     Status: None   Collection Time: 12/03/18  6:19 PM  Result Value Ref Range   SARS Coronavirus 2 NEGATIVE NEGATIVE    Comment: (NOTE) If result is NEGATIVE SARS-CoV-2 target nucleic acids are NOT DETECTED. The SARS-CoV-2 RNA is generally detectable in upper and lower  respiratory specimens during the acute phase of infection. The lowest  concentration of SARS-CoV-2 viral copies this assay can detect is 250  copies / mL. A negative result does not preclude SARS-CoV-2  infection  and should not be used as the sole basis for treatment or other  patient management decisions.  A negative result  may occur with  improper specimen collection / handling, submission of specimen other  than nasopharyngeal swab, presence of viral mutation(s) within the  areas targeted by this assay, and inadequate number of viral copies  (<250 copies / mL). A negative result must be combined with clinical  observations, patient history, and epidemiological information. If result is POSITIVE SARS-CoV-2 target nucleic acids are DETECTED. The SARS-CoV-2 RNA is generally detectable in upper and lower  respiratory specimens dur ing the acute phase of infection.  Positive  results are indicative of active infection with SARS-CoV-2.  Clinical  correlation with patient history and other diagnostic information is  necessary to determine patient infection status.  Positive results do  not rule out bacterial infection or co-infection with other viruses. If result is PRESUMPTIVE POSTIVE SARS-CoV-2 nucleic acids MAY BE PRESENT.   A presumptive positive result was obtained on the submitted specimen  and confirmed on repeat testing.  While 2019 novel coronavirus  (SARS-CoV-2) nucleic acids may be present in the submitted sample  additional confirmatory testing may be necessary for epidemiological  and / or clinical management purposes  to differentiate between  SARS-CoV-2 and other Sarbecovirus currently known to infect humans.  If clinically indicated additional testing with an alternate test  methodology [phone]([id number]) is advised. The SARS-CoV-2 RNA is generally  detectable in upper and lower respiratory sp ecimens during the acute  phase of infection. The expected result is Negative. Fact Sheet for Patients:  BoilerBrush.com.cyhttps://www.fda.gov/media/136312/download Fact Sheet for Healthcare Providers: https://pope.com/https://www.fda.gov/media/136313/download This test is not yet approved or cleared by the [place]  FDA and has been authorized for detection and/or diagnosis of SARS-CoV-2 by FDA under an Emergency Use Authorization (EUA).  This EUA will remain in effect (meaning this test can be used) for the duration of the COVID-19 declaration under Section 564(b)(1) of the Act, 21 U.S.C. section 360bbb-3(b)(1), unless the authorization is terminated or revoked sooner. Performed at [facility], [address] [place], [place] [address]   Troponin I - ONCE - STAT     Status: None   Collection Time: 12/03/18  8:20 PM  Result Value Ref Range   Troponin I <0.03 <0.03 ng/mL    Comment: Performed at [facility], [address] [place], [place] [address]  Urinalysis, Routine w reflex microscopic     Status: Abnormal   Collection Time: 12/03/18  8:30 PM  Result Value Ref Range   Color, Urine YELLOW YELLOW   APPearance HAZY (A) CLEAR   Specific Gravity, Urine 1.009 1.005 - 1.030   pH 7.0 5.0 - 8.0   Glucose, UA NEGATIVE NEGATIVE mg/dL   Hgb urine dipstick NEGATIVE NEGATIVE   Bilirubin Urine NEGATIVE NEGATIVE   Ketones, ur NEGATIVE NEGATIVE mg/dL   Protein, ur NEGATIVE NEGATIVE mg/dL   Nitrite POSITIVE (A) NEGATIVE   Leukocytes,Ua LARGE (A) NEGATIVE   RBC / HPF 0-5 0 - 5 RBC/hpf   WBC, UA 21-50 0 - 5 WBC/hpf   Bacteria, UA NONE SEEN NONE SEEN    Comment: Performed at [facility], [address] [place], [place] [address]   Ct Head Wo Contrast  Result Date: 12/03/2018 CLINICAL DATA:  Altered LOC EXAM: CT HEAD WITHOUT CONTRAST TECHNIQUE: Contiguous axial images were obtained from the base of the skull through the vertex without intravenous contrast. COMPARISON:  None. FINDINGS: Brain: No acute territorial infarction, hemorrhage, or intracranial mass. Moderate atrophy. Moderate hypodensity within the periventricular white matter, likely small vessel ischemic disease.  Mildly prominent ventricles, felt secondary to atrophy. Mild cerebellar atrophy. Vascular: No hyperdense vessels.   Carotid vascular calcification. Skull: Normal. Negative for fracture or focal lesion. Sinuses/Orbits: Mucosal thickening within the ethmoid sinuses. Retention cyst in the right sphenoid sinus. Other: None IMPRESSION: 1. No CT evidence for acute intracranial abnormality. 2. Atrophy and small vessel ischemic changes of the white matter Electronically Signed   By: Jasmine Pang M.D.   On: 12/03/2018 19:22   Dg Chest Port 1 View  Result Date: 12/03/2018 CLINICAL DATA:  Altered mental status EXAM: PORTABLE CHEST 1 VIEW COMPARISON:  04/13/2015 FINDINGS: Incomplete inclusion of the left base. Right lung is clear. Hazy opacity left base may be due to mild atelectasis. Heart size within normal limits. No pneumothorax IMPRESSION: Incomplete inclusion of left lung base. Possible hazy atelectasis at the left base Electronically Signed   By: Jasmine Pang M.D.   On: 12/03/2018 19:27    Pending Labs Unresulted Labs (From admission, onward)    Start     Ordered   12/04/18 0500  Basic metabolic panel  Tomorrow morning,   R     12/03/18 2218   12/04/18 0500  CBC  Tomorrow morning,   R     12/03/18 2218   12/03/18 2219  TSH  Once,   R     12/03/18 2218   12/03/18 1842  Urine culture  ONCE - STAT,   STAT     12/03/18 1841          Vitals/Pain Today's Vitals   12/03/18 1400 12/03/18 1935 12/03/18 1939 12/03/18 2200  BP: (!) 173/89 (!) 175/53  (!) 187/69  Pulse:  73  (!) 101  Resp: 15 (!) 6  18  Temp:      TempSrc:      SpO2:  99%  (!) 87%  Weight:      Height:      PainSc:   0-No pain     Isolation Precautions No active isolations  Medications Medications  cefTRIAXone (ROCEPHIN) 1 g in sodium chloride 0.9 % 100 mL IVPB (has no administration in time range)  sodium chloride 0.9 % bolus 500 mL (has no administration in time range)  heparin injection 5,000 Units (has no administration in time range)  sodium chloride flush (NS) 0.9 % injection 3 mL (has no administration in time range)   acetaminophen (TYLENOL) tablet 650 mg (has no administration in time range)    Or  acetaminophen (TYLENOL) suppository 650 mg (has no administration in time range)  aspirin tablet 81 mg (has no administration in time range)  pravastatin (PRAVACHOL) tablet 80 mg (has no administration in time range)  insulin aspart (novoLOG) injection 0-9 Units (has no administration in time range)  sodium chloride 0.9 % bolus 500 mL (0 mLs Intravenous Stopped 12/03/18 2222)  cefTRIAXone (ROCEPHIN) 1 g in sodium chloride 0.9 % 100 mL IVPB (0 g Intravenous Stopped 12/03/18 2221)    Mobility walks with device High fall risk   Focused Assessments Cardiac Assessment Handoff:  Cardiac Rhythm: Sinus bradycardia Lab Results  Component Value Date   TROPONINI <0.03 12/03/2018   No results found for: DDIMER Does the Patient currently have chest pain? No     R Recommendations: See Admitting Provider Note  Report given to:   Additional Notes: Pt is confused.  Denies any pain.  Pt has had a low HR, lowest seen in ED 33.  Pt is confused and taking all monitoring equipment off.

## 2018-12-03 NOTE — ED Provider Notes (Signed)
[facility] Provider Note   CSN: [id number] Arrival date & time: 12/03/18  1208    History   Chief Complaint Chief Complaint  Patient presents with  . Bradycardia    HPI Alexander Powell is a 85 y.o. male.     HPI Patient is sent by EMS from his PCP office for low heart rate.  He had gone with his wife this morning.  He has been having some weight loss and decreased energy level.  This week he seemed to be doing a little worse and they had an appointment.  Patient does ambulate with assistance.  Patient reports that he feels fine.  He does not notice any symptoms. Past Medical History:  Diagnosis Date  . DM (diabetes mellitus), type 2 (HCC)   . Hearing loss    pt wear hearing aid  . History of stress test 05/2006   Showed a mild to moderate perfusion defect in the apical wall with no reversibility. He also had a mild to moderate perfusion defect in the mid inferior wall with no reversibilty, and a mild to moderate perfusion defect in the apicl inferior wall with no reversibility. There is thought thatthis could be potentially due to infarct or scar, but not corroborated with his echo.   Marland Kitchen. Hx of echocardiogram 09/2010   Normal LV size with proximal septal thickening, but normal EF at greater than 55% with mild impaired relaxation. Mildly sclerotic, but not stenotic, aortic valve with aortic sclerosis noted, but no other significant lesions or abnormalities, just some left atrial calcification.  . Hyperlipidemia   . Hypertension     Patient Active Problem List   Diagnosis Date Noted  . Acute gangrenous cholecystitis 04/15/2015  . Gallbladder calculus with acute cholecystitis and no obstruction 04/13/2015  . Gallbladder calculus with acute cholecystitis 04/13/2015    Past Surgical History:  Procedure Laterality Date  . BACK SURGERY  1995  . CHOLECYSTECTOMY N/A 04/14/2015   Procedure: LAPAROSCOPIC CHOLECYSTECTOMY;  Surgeon: Glenna FellowsBenjamin  Hoxworth, MD;  Location: [facility];  Service: General;  Laterality: N/A;        Home Medications    Prior to Admission medications   Medication Sig Start Date End Date Taking? Authorizing Provider  aspirin 81 MG tablet Take 81 mg by mouth every evening.    Yes [provider]  glipiZIDE (GLUCOTROL XL) 2.5 MG 24 hr tablet Take 2.5 mg by mouth daily with breakfast.   Yes [provider]  hydrALAZINE (APRESOLINE) 25 MG tablet Take 25 mg by mouth 2 (two) times a day. 12/01/18  Yes [provider]  metFORMIN (GLUCOPHAGE) 500 MG tablet Take 500 mg by mouth every morning. 10/06/18  Yes [provider]  Multiple Vitamin (MULTIVITAMIN WITH MINERALS) TABS tablet Take 1 tablet by mouth daily.   Yes [provider]  pravastatin (PRAVACHOL) 80 MG tablet Take 80 mg by mouth daily after lunch.  03/21/15  Yes [provider]    Family History History reviewed. No pertinent family history.  Social History Social History   Tobacco Use  . Smoking status: Former Smoker    Types: Cigarettes    Last attempt to quit: 07/25/1988    Years since quitting: 30.3  . Smokeless tobacco: Never Used  Substance Use Topics  . Alcohol use: Yes    Alcohol/week: 0.0 standard drinks    Comment: occassionally  . Drug use: No     Allergies   Patient has no known allergies.  Review of Systems Review of Systems 10 Systems reviewed and are negative for acute change except as noted in the HPI.   Physical Exam Updated Vital Signs BP (!) 173/89   Pulse (!) 34   Temp 97.9 F (36.6 C) (Oral)   Resp 15   Ht  (1.702 m)   Wt 74.8 kg   SpO2 98%   BMI 25.84 kg/m   Physical Exam Constitutional:      Comments: Patient is alert.  He has no respiratory distress.  He is answering questions but does seem cognitively delayed suggestive of dementia.  HENT:     Head: Normocephalic and atraumatic.     Mouth/Throat:     Pharynx: Oropharynx is clear.  Eyes:      Extraocular Movements: Extraocular movements intact.  Neck:     Musculoskeletal: Neck supple.  Cardiovascular:     Comments: Irregularly irregular.  2\6 systolic ejection murmur. Pulmonary:     Comments: No increased work of breathing at rest.  Lungs are grossly clear. Abdominal:     General: There is no distension.     Palpations: Abdomen is soft.     Tenderness: There is no abdominal tenderness. There is no guarding.  Musculoskeletal: Normal range of motion.        General: No swelling.     Right lower leg: No edema.     Left lower leg: No edema.  Skin:    General: Skin is warm and dry.  Neurological:     Comments: Patient follows commands.  He does answer questions.  He seems somewhat confused.  No focal motor deficits.      ED Treatments / Results  Labs (all labs ordered are listed, but only abnormal results are displayed) Labs Reviewed  BASIC METABOLIC PANEL - Abnormal; Notable for the following components:      Result Value   Potassium 5.3 (*)    BUN 25 (*)    Creatinine, Ser 1.48 (*)    GFR calc non Af Amer 40 (*)    GFR calc Af Amer 46 (*)    All other components within normal limits  CBC WITH DIFFERENTIAL/PLATELET - Abnormal; Notable for the following components:   RBC 4.21 (*)    Hemoglobin 11.8 (*)    MCHC 29.7 (*)    All other components within normal limits  TROPONIN I  PROTIME-INR    EKG EKG Interpretation  Date/Time:  Friday Dec 03 2018 12:20:43 EDT Ventricular Rate:  37 PR Interval:    QRS Duration: 84 QT Interval:  483 QTC Calculation: 379 R Axis:   -22 Text Interpretation:  Atrial flutter Borderline left axis deviation Anteroseptal infarct, old Repol abnrm suggests ischemia, diffuse leads agree, flutter new from previous tracing Confirmed by Arby Barrette [phone]) on 12/03/2018 12:55:22 PM   Radiology No results found.  Procedures Procedures (including critical care time)  Medications Ordered in ED Medications - No data to  display   Initial Impression / Assessment and Plan / ED Course  I have reviewed the triage vital signs and the nursing notes.  Pertinent labs & imaging results that were available during my care of the patient were reviewed by me and considered in my medical decision making (see chart for details).       I did have an extensive conversation with the patient's wife and daughter on the telephone.  Patient's daughter previously worked at [facility] cardiology.  They do want medical management for bradycardia and atrial flutter.  Interested in additional diagnostic evaluation.  They think a pacemaker probably is not an option for the patient given his age.  They do however wish to maintain his CODE STATUS is a full code at this time.  We did discuss that at the patient's age, comes from a code situation are often very poor.  They felt they would proceed with the decision to withdraw care at that time if the patient was not doing well.  She presents with atrial flutter by EKG with bradycardia.  He has no respiratory distress at rest, no signs of acute CHF.  Unknown how long the patient may have been in this rhythm.  Labs are stable at baseline.  Cardiology has been consulted.  Patient likely will need medical admission for monitoring and assessment for feasible management from [facility].  Patient's wife and daughter were contacted at the home number listed in demographics.  Final Clinical Impressions(s) / ED Diagnoses   Final diagnoses:  Bradycardia  Atrial flutter, unspecified type [facility])  Advanced age    ED Discharge Orders    None       Arby Barrette, MD 12/03/18 1547

## 2018-12-03 NOTE — ED Triage Notes (Signed)
Pt BIB [facility] from Dr.'s office. Pt was seen there today for bradycardia. Per EMS pt has been failing to thrive for the last month and this week it has gotten worse. Pt ambulates with assistance at home. Pt is oriented to self and this is his baseline.  158/58 CBG 140 61F 100% RA HR 40

## 2018-12-03 NOTE — Consult Note (Signed)
Cardiology Consultation:   Patient ID: Alexander Powell MRN: [id number]; DOB: [birth year]  Admit date: 12/03/2018 Date of Consult: 12/03/2018  [facility] Primary Cardiologist: No primary care provider on file. [facility] remotely Primary Electrophysiologist:  None    Patient Profile:   Alexander Powell is a 85 y.o. male with a hx of HTN who is being seen today for the evaluation of heart block and atrial flutter at the request of Yao.  History of Present Illness:   Mr. Alexander Powell is a pleasantly demented 85 year old man with a history of hypertension and dyslipidemia.  The patient has had a progressive worsening over the last several weeks.  The patient can provide me no history, and appears to have very little understanding answering questions only yes or no or no answer at all.  I spoke to his wife and his stepdaughter on the phone extensively.  She states that over the last month he has had gradual weakness, fatigue, and has been reluctant to get out of bed.  He has been confused and his appetite is been poor.  Over the last several days, his symptoms have progressed.  She did not note any fevers or chills.  No syncope.  He has not had any falls.  He has remained in bed much of the time.  The patient can give me no history whatsoever.  He is sitting up in his bed and confused.  He has pulled his IVs out.  He has pulled his electrodes off.  Past Medical History:  Diagnosis Date  . DM (diabetes mellitus), type 2 (HCC)   . Hearing loss    pt wear hearing aid  . History of stress test 05/2006   Showed a mild to moderate perfusion defect in the apical wall with no reversibility. He also had a mild to moderate perfusion defect in the mid inferior wall with no reversibilty, and a mild to moderate perfusion defect in the apicl inferior wall with no reversibility. There is thought thatthis could be potentially due to infarct or scar, but not corroborated with his  echo.   Marland Kitchen. Hx of echocardiogram 09/2010   Normal LV size with proximal septal thickening, but normal EF at greater than 55% with mild impaired relaxation. Mildly sclerotic, but not stenotic, aortic valve with aortic sclerosis noted, but no other significant lesions or abnormalities, just some left atrial calcification.  . Hyperlipidemia   . Hypertension     Past Surgical History:  Procedure Laterality Date  . BACK SURGERY  1995  . CHOLECYSTECTOMY N/A 04/14/2015   Procedure: LAPAROSCOPIC CHOLECYSTECTOMY;  Surgeon: Glenna FellowsBenjamin Hoxworth, MD;  Location: [facility];  Service: General;  Laterality: N/A;     Home Medications:  Prior to Admission medications   Medication Sig Start Date End Date Taking? Authorizing Provider  aspirin 81 MG tablet Take 81 mg by mouth every evening.    Yes [provider]  glipiZIDE (GLUCOTROL XL) 2.5 MG 24 hr tablet Take 2.5 mg by mouth daily with breakfast.   Yes [provider]  hydrALAZINE (APRESOLINE) 25 MG tablet Take 25 mg by mouth 2 (two) times a day. 12/01/18  Yes [provider]  metFORMIN (GLUCOPHAGE) 500 MG tablet Take 500 mg by mouth every morning. 10/06/18  Yes [provider]  Multiple Vitamin (MULTIVITAMIN WITH MINERALS) TABS tablet Take 1 tablet by mouth daily.   Yes [provider]  pravastatin (PRAVACHOL) 80 MG tablet Take 80 mg by mouth daily after lunch.  03/21/15  Yes [provider]    Inpatient Medications: Scheduled Meds:  Continuous Infusions:  PRN Meds:   Allergies:   No Known Allergies  Social History:   Social History   Socioeconomic History  . Marital status: Married    Spouse name: Not on file  . Number of children: Not on file  . Years of education: Not on file  . Highest education level: Not on file  Occupational History  . Not on file  Social Needs  . Financial resource strain: Not on file  . Food insecurity:    Worry: Not on file    Inability: Not on file  .  Transportation needs:    Medical: Not on file    Non-medical: Not on file  Tobacco Use  . Smoking status: Former Smoker    Types: Cigarettes    Last attempt to quit: 07/25/1988    Years since quitting: 30.3  . Smokeless tobacco: Never Used  Substance and Sexual Activity  . Alcohol use: Yes    Alcohol/week: 0.0 standard drinks    Comment: occassionally  . Drug use: No  . Sexual activity: Never  Lifestyle  . Physical activity:    Days per week: Not on file    Minutes per session: Not on file  . Stress: Not on file  Relationships  . Social connections:    Talks on phone: Not on file    Gets together: Not on file    Attends religious service: Not on file    Active member of club or organization: Not on file    Attends meetings of clubs or organizations: Not on file    Relationship status: Not on file  . Intimate partner violence:    Fear of current or ex partner: Not on file    Emotionally abused: Not on file    Physically abused: Not on file    Forced sexual activity: Not on file  Other Topics Concern  . Not on file  Social History Narrative  . Not on file    Family History:   History reviewed. No pertinent family history.   ROS:  Please see the history of present illness.   All other ROS reviewed and negative.     Physical Exam/Data:   Vitals:   12/03/18 1221 12/03/18 1300 12/03/18 1330 12/03/18 1400  BP: (!) 153/49 (!) 137/51 (!) 133/42 (!) 173/89  Pulse: (!) 45 (!) 35 (!) 34   Resp: (!) 21 19 15 15   Temp: 97.9 F (36.6 C)     TempSrc: Oral     SpO2: 99% 99% 98%   Weight:      Height:       No intake or output data in the 24 hours ending 12/03/18 1803 Last 3 Weights 12/03/2018 05/04/2015 04/14/2015  Weight (lbs) 165 lb 153 lb 9.6 oz 154 lb 15.7 oz  Weight (kg) 74.844 kg 69.673 kg 70.3 kg     Body mass index is 25.84 kg/m.  General:  Very thin and frail, well developed, in no acute distress HEENT: normal Lymph: no adenopathy Neck: no JVD Endocrine:   No thryomegaly Vascular: No carotid bruits; FA pulses 2+ bilaterally without bruits  Cardiac:  ireg brady Lungs:  clear to auscultation bilaterally, no wheezing, rhonchi or rales  Abd: soft, nontender, no hepatomegaly  Ext: no edema Musculoskeletal:  No deformities, BUE and BLE strength normal and equal Skin: warm and dry  Neuro:  CNs 2-12 intact, no focal abnormalities  noted Psych:  Normal affect   EKG:  The EKG was personally reviewed and demonstrates: Atrial flutter with a slow ventricular response Telemetry:  Telemetry was personally reviewed and demonstrates: Atrial flutter with a slow ventricular response  Relevant CV Studies: None  Laboratory Data:  Chemistry Recent Labs  Lab 12/03/18 1315  NA 142  K 5.3*  CL 108  CO2 23  GLUCOSE 93  BUN 25*  CREATININE 1.48*  CALCIUM 9.6  GFRNONAA 40*  GFRAA 46*  ANIONGAP 11    No results for input(s): PROT, ALBUMIN, AST, ALT, ALKPHOS, BILITOT in the last 168 hours. Hematology Recent Labs  Lab 12/03/18 1315  WBC 6.4  RBC 4.21*  HGB 11.8*  HCT 39.7  MCV 94.3  MCH 28.0  MCHC 29.7*  RDW 14.5  PLT 251   Cardiac Enzymes Recent Labs  Lab 12/03/18 1315  TROPONINI <0.03   No results for input(s): TROPIPOC in the last 168 hours.  BNPNo results for input(s): BNP, PROBNP in the last 168 hours.  DDimer No results for input(s): DDIMER in the last 168 hours.  Radiology/Studies:  No results found.  Assessment and Plan:   1. High-grade heart block -if the patient were cooperative and mentally alert, permanent pacemaker insertion would be a strong consideration, and might help improve his symptoms of fatigue and weakness.  Because he is unable to cooperate, and because of his dementia, there is no indication for either a permanent or temporary pacemaker. 2. Atrial flutter -he has a typical atrial flutter with a very slow ventricular response between 35 and 50 bpm.  He is not a candidate for systemic  anticoagulation 3. Hypertension -he remains on hydralazine.  He is on no AV nodal blocking agents. 4. Altered mental status -I think it would be imperative to rule out an underlying infectious etiology which might be one way that we could reverse his mental issues.  If the patient's mental status were to improve, and he was able to give consent, and cooperate, permanent pacemaker insertion might be a consideration.  If the patient does not improve mentally, hospice/palliative care/skilled nursing facility would be the best treatment. 5. Disposition -I have discussed the patient's multitude of problems with his wife and his daughter.  I have explained the situation with him and that I do not feel like permanent pacemaker insertion at this time would provide any meaningful benefit to this patient.  Hopefully his mental status will improve and we could reconsider this.  While his heart rate is slow, it is not to the point that I think it is contributing to his altered mental status whatsoever.      At this point, cardiology will sign off.  I would not recommend telemetry for this patient.  If his mental status improves, please do not hesitate to contact us to reassess the feasibility of permanent pacemaker insertion.  [facility] will sign off.   Medication Recommendations:  none Other recommendations (labs, testing, etc):  Work up altered mental status/rule out occult infection Follow up as an outpatient:  n/a  For questions or updates, please contact [facility] Please consult [email or web address] for contact info under   Signed, Lewayne Bunting, MD  12/03/2018 6:03 PM

## 2018-12-03 NOTE — ED Notes (Signed)
Pt continues to remove monitoring equipment.  

## 2018-12-03 NOTE — H&P (Signed)
History and Physical    Alexander Powell JWJ:[id number] DOB: 1921-05-08 DOA: 12/03/2018  PCP: [facility]  Patient coming from: PCP office  I have personally briefly reviewed patient's old medical records in [facility]  Chief Complaint: Altered mental status  HPI: Alexander Powell is a 85 y.o. male with medical history significant for first-degree AV block, T2DM, HTN, HLD, CKD Stage III, and hearing loss who was sent to the ED by his PCP for bradycardia.  Patient is confused unable to provide significant history therefore majority of history is obtained from EDP and chart review.  Per documentation, patient has been having decreased energy and weight loss recently which is worsened over this past week.  He presented to his PCPs office was found to be bradycardic and subsequently sent to the ED.  Patient is confused oriented only to self but answers occasional yes no questions.  He denies any chest pain or abdominal pain.  He reports having burning with urination day before yesterday.  He is otherwise unable to provide further history.  ED Course:  Initial vitals showed BP 137/51, pulse 48, RR 19, temp 97.9 Fahrenheit, SPO2 99% on room air.  Labs are notable for WBC 6.4, hemoglobin 11.8, platelets 251,000, potassium 5.3, BUN 25, creatinine 1.48 with GFR 46, troponin I <0.03.  EKG showed A flutter with bradycardia, rate 37.  Cardiology were consulted and did not feel patient was a candidate for pacemaker placement due to current confusion and poor cooperation.  CT head without contrast was negative for acute intracranial abnormality.  Portable chest x-ray not show obvious focal consolidation, edema, or right-sided effusion.  Left lung base was not included on film.  Urinalysis showed positive nitrates, large leukocytes, no bacteria on microscopy.  Urine culture was obtained patient was started on IV ceftriaxone hospital service was consulted to admit for further management of  encephalopathy thought due to UTI.  SARS-CoV-2 test was negative.  Review of Systems:  Unable to obtain full review of systems due to patient's encephalopathy.   Past Medical History:  Diagnosis Date  . DM (diabetes mellitus), type 2 (HCC)   . Hearing loss    pt wear hearing aid  . History of stress test 05/2006   Showed a mild to moderate perfusion defect in the apical wall with no reversibility. He also had a mild to moderate perfusion defect in the mid inferior wall with no reversibilty, and a mild to moderate perfusion defect in the apicl inferior wall with no reversibility. There is thought thatthis could be potentially due to infarct or scar, but not corroborated with his echo.   Marland Kitchen Hx of echocardiogram 09/2010   Normal LV size with proximal septal thickening, but normal EF at greater than 55% with mild impaired relaxation. Mildly sclerotic, but not stenotic, aortic valve with aortic sclerosis noted, but no other significant lesions or abnormalities, just some left atrial calcification.  . Hyperlipidemia   . Hypertension     Past Surgical History:  Procedure Laterality Date  . BACK SURGERY  1995  . CHOLECYSTECTOMY N/A 04/14/2015   Procedure: LAPAROSCOPIC CHOLECYSTECTOMY;  Surgeon: Glenna Fellows, MD;  Location: [facility];  Service: General;  Laterality: N/A;    Social History:  reports that he quit smoking about 30 years ago. His smoking use included cigarettes. He has never used smokeless tobacco. He reports current alcohol use. He reports that he does not use drugs.  No Known Allergies  History reviewed. No pertinent family history.  Prior to Admission medications   Medication Sig Start Date End Date Taking? Authorizing Provider  aspirin 81 MG tablet Take 81 mg by mouth every evening.    Yes [provider]  glipiZIDE (GLUCOTROL XL) 2.5 MG 24 hr tablet Take 2.5 mg by mouth daily with breakfast.   Yes [provider]  hydrALAZINE (APRESOLINE) 25 MG tablet  Take 25 mg by mouth 2 (two) times a day. 12/01/18  Yes [provider]  metFORMIN (GLUCOPHAGE) 500 MG tablet Take 500 mg by mouth every morning. 10/06/18  Yes [provider]  Multiple Vitamin (MULTIVITAMIN WITH MINERALS) TABS tablet Take 1 tablet by mouth daily.   Yes [provider]  pravastatin (PRAVACHOL) 80 MG tablet Take 80 mg by mouth daily after lunch.  03/21/15  Yes [provider]    Physical Exam: Vitals:   12/03/18 1400 12/03/18 1935 12/03/18 2200 12/03/18 2315  BP: (!) 173/89 (!) 175/53 (!) 187/69 (!) 198/64  Pulse:  73 (!) 101 (!) 42  Resp: 15 (!) 6 18 16   Temp:      TempSrc:      SpO2:  99%  99%  Weight:      Height:        Constitutional: Elderly man resting supine in bed, NAD, calm, comfortable Eyes: PERRL, lids and conjunctivae normal ENMT: Mucous membranes are dry. Posterior pharynx clear of any exudate or lesions.Normal dentition.  Neck: normal, supple, no masses. Respiratory: clear to auscultation bilaterally, no wheezing, no crackles. Normal respiratory effort. No accessory muscle use.  Cardiovascular: Bradycardic, no murmurs / rubs / gallops. No extremity edema. 2+ pedal pulses. Abdomen: no tenderness, no masses palpated. No hepatosplenomegaly. Bowel sounds positive.  Musculoskeletal: no clubbing / cyanosis. No joint deformity upper and lower extremities. Good ROM, no contractures.  Skin: no rashes, lesions, ulcers. No induration Neurologic: CN 2-12 grossly intact. Sensation intact, Strength 5/5 in all 4.  Psychiatric: Alert and oriented to self only, states the year is 1930 and that he is living with his grandmother.    Labs on Admission: I have personally reviewed following labs and imaging studies  CBC: Recent Labs  Lab 12/03/18 1315  WBC 6.4  NEUTROABS 3.7  HGB 11.8*  HCT 39.7  MCV 94.3  PLT 251   Basic Metabolic Panel: Recent Labs  Lab 12/03/18 1315  NA 142  K 5.3*  CL 108  CO2 23  GLUCOSE 93  BUN 25*   CREATININE 1.48*  CALCIUM 9.6   GFR: Estimated Creatinine Clearance: 27.9 mL/min (A) (by C-G formula based on SCr of 1.48 mg/dL (H)). Liver Function Tests: No results for input(s): AST, ALT, ALKPHOS, BILITOT, PROT, ALBUMIN in the last 168 hours. No results for input(s): LIPASE, AMYLASE in the last 168 hours. No results for input(s): AMMONIA in the last 168 hours. Coagulation Profile: Recent Labs  Lab 12/03/18 1315  INR 1.1   Cardiac Enzymes: Recent Labs  Lab 12/03/18 1315 12/03/18 2020  TROPONINI <0.03 <0.03   BNP (last 3 results) No results for input(s): PROBNP in the last 8760 hours. HbA1C: No results for input(s): HGBA1C in the last 72 hours. CBG: Recent Labs  Lab 12/03/18 2330  GLUCAP 103*   Lipid Profile: No results for input(s): CHOL, HDL, LDLCALC, TRIG, CHOLHDL, LDLDIRECT in the last 72 hours. Thyroid Function Tests: No results for input(s): TSH, T4TOTAL, FREET4, T3FREE, THYROIDAB in the last 72 hours. Anemia Panel: No results for input(s): VITAMINB12, FOLATE, FERRITIN, TIBC, IRON, RETICCTPCT in the last  72 hours. Urine analysis:    Component Value Date/Time   COLORURINE YELLOW 12/03/2018 2030   APPEARANCEUR HAZY (A) 12/03/2018 2030   LABSPEC 1.009 12/03/2018 2030   PHURINE 7.0 12/03/2018 2030   GLUCOSEU NEGATIVE 12/03/2018 2030   HGBUR NEGATIVE 12/03/2018 2030   BILIRUBINUR NEGATIVE 12/03/2018 2030   KETONESUR NEGATIVE 12/03/2018 2030   PROTEINUR NEGATIVE 12/03/2018 2030   NITRITE POSITIVE (A) 12/03/2018 2030   LEUKOCYTESUR LARGE (A) 12/03/2018 2030    Radiological Exams on Admission: Ct Head Wo Contrast  Result Date: 12/03/2018 CLINICAL DATA:  Altered LOC EXAM: CT HEAD WITHOUT CONTRAST TECHNIQUE: Contiguous axial images were obtained from the base of the skull through the vertex without intravenous contrast. COMPARISON:  None. FINDINGS: Brain: No acute territorial infarction, hemorrhage, or intracranial mass. Moderate atrophy. Moderate hypodensity  within the periventricular white matter, likely small vessel ischemic disease. Mildly prominent ventricles, felt secondary to atrophy. Mild cerebellar atrophy. Vascular: No hyperdense vessels.  Carotid vascular calcification. Skull: Normal. Negative for fracture or focal lesion. Sinuses/Orbits: Mucosal thickening within the ethmoid sinuses. Retention cyst in the right sphenoid sinus. Other: None IMPRESSION: 1. No CT evidence for acute intracranial abnormality. 2. Atrophy and small vessel ischemic changes of the white matter Electronically Signed   By: Jasmine Pang M.D.   On: 12/03/2018 19:22   Dg Chest Port 1 View  Result Date: 12/03/2018 CLINICAL DATA:  Altered mental status EXAM: PORTABLE CHEST 1 VIEW COMPARISON:  04/13/2015 FINDINGS: Incomplete inclusion of the left base. Right lung is clear. Hazy opacity left base may be due to mild atelectasis. Heart size within normal limits. No pneumothorax IMPRESSION: Incomplete inclusion of left lung base. Possible hazy atelectasis at the left base Electronically Signed   By: Jasmine Pang M.D.   On: 12/03/2018 19:27    EKG: Independently reviewed. A flutter with bradycardia, rate 37.  Bradycardia and a flutter is new compared to prior.  Previous EKG showed possible Wenckebach block.  Assessment/Plan Principal Problem:   UTI (urinary tract infection) Active Problems:   Bradycardia   Hypertension   Hyperlipidemia   DM (diabetes mellitus), type 2 (HCC)   CKD (chronic kidney disease) stage 3, GFR 30-59 ml/min (HCC)  Alexander Powell is a 85 y.o. male with medical history significant for first-degree AV block, T2DM, HTN, HLD, CKD Stage III, and hearing loss who is admitted with encephalopathy due to UTI and bradycardia.   UTI: Likely cause of encephalopathy in addition to bradycardia and likely age-related cognitive dysfunction. -Continue IV ceftriaxone -Follow-up urine cultures  Atrial flutter with bradycardia: With high-grade heart block.  Seen by  cardiology and not considered a pacemaker candidate at this time.  No further cardiac work-up suggested.  Patient is not on any AV nodal blockers.  He is not an anticoagulation candidate, will continue low-dose aspirin. -Continue to monitor  Hypertension: -Resume home hydralazine  Type 2 diabetes: On glipizide and metformin as an outpatient. -Sensitive SSI while in hospital  CKD stage III: Chronic and stable.  Hyperlipidemia: -Continue pravastatin   DVT prophylaxis: Subcutaneous heparin Code Status: Full code, per EDP conversation with family Family Communication: Attempted to call spouse without answer Disposition Plan: Pending clinical progress Consults called: Cardiology Admission status: Observation   Darreld Mclean MD [facility]  If 7PM-7AM, please contact night-coverage [email or web address]  12/04/2018, 12:54 AM

## 2018-12-03 NOTE — ED Notes (Signed)
Talked to wife for and daughter for an update

## 2018-12-03 NOTE — ED Provider Notes (Addendum)
  Physical Exam  BP (!) 173/89   Pulse (!) 34   Temp 97.9 F (36.6 C) (Oral)   Resp 15   Ht 5\' 7"  (1.702 m)   Wt 74.8 kg   SpO2 98%   BMI 25.84 kg/m   Physical Exam  ED Course/Procedures     Procedures  MDM  Patient care assumed at 3 pm from Dr. Donnald Garre.  Patient has been weak and was found to be in A. fib with a bradycardic in the 30s.  Was thought to have symptomatic bradycardia.  Signout pending cardiology consult and likely admission.  6:06 PM Dr. Ladona Ridgel saw patient. Recommend admission for observation and hold antihypertensives for now. Since he is 89, recommend medicine admission. Hospitalist to admit. COVID testing pending but he has no known exposures.   7 pm Dr. Allena Katz called back. Dr. Lubertha Basque note states that patient cleared by cardiology but recommend CT head, UA to r/o infectious causes.   9:26 PM CT head unremarkable. UA + UTI. Given rocephin for UTI. Talked to family and patient has been confused, which can be caused by his UTI. COVID negative, CXR clear.       Charlynne Pander, MD 12/03/18 1806    Charlynne Pander, MD 12/03/18 [phone]

## 2018-12-04 DIAGNOSIS — I129 Hypertensive chronic kidney disease with stage 1 through stage 4 chronic kidney disease, or unspecified chronic kidney disease: Secondary | ICD-10-CM | POA: Diagnosis present

## 2018-12-04 DIAGNOSIS — I4892 Unspecified atrial flutter: Secondary | ICD-10-CM | POA: Diagnosis not present

## 2018-12-04 DIAGNOSIS — I459 Conduction disorder, unspecified: Secondary | ICD-10-CM | POA: Diagnosis present

## 2018-12-04 DIAGNOSIS — I10 Essential (primary) hypertension: Secondary | ICD-10-CM

## 2018-12-04 DIAGNOSIS — Z87891 Personal history of nicotine dependence: Secondary | ICD-10-CM | POA: Diagnosis not present

## 2018-12-04 DIAGNOSIS — N3000 Acute cystitis without hematuria: Secondary | ICD-10-CM | POA: Diagnosis not present

## 2018-12-04 DIAGNOSIS — G9340 Encephalopathy, unspecified: Secondary | ICD-10-CM | POA: Diagnosis present

## 2018-12-04 DIAGNOSIS — E11649 Type 2 diabetes mellitus with hypoglycemia without coma: Secondary | ICD-10-CM | POA: Diagnosis present

## 2018-12-04 DIAGNOSIS — G9341 Metabolic encephalopathy: Secondary | ICD-10-CM

## 2018-12-04 DIAGNOSIS — I4891 Unspecified atrial fibrillation: Secondary | ICD-10-CM | POA: Diagnosis present

## 2018-12-04 DIAGNOSIS — N390 Urinary tract infection, site not specified: Secondary | ICD-10-CM | POA: Diagnosis not present

## 2018-12-04 DIAGNOSIS — R001 Bradycardia, unspecified: Secondary | ICD-10-CM | POA: Diagnosis not present

## 2018-12-04 DIAGNOSIS — I483 Typical atrial flutter: Secondary | ICD-10-CM | POA: Diagnosis not present

## 2018-12-04 DIAGNOSIS — E785 Hyperlipidemia, unspecified: Secondary | ICD-10-CM | POA: Diagnosis present

## 2018-12-04 DIAGNOSIS — N183 Chronic kidney disease, stage 3 (moderate): Secondary | ICD-10-CM | POA: Diagnosis present

## 2018-12-04 DIAGNOSIS — E860 Dehydration: Secondary | ICD-10-CM | POA: Diagnosis not present

## 2018-12-04 DIAGNOSIS — F0391 Unspecified dementia with behavioral disturbance: Secondary | ICD-10-CM | POA: Diagnosis present

## 2018-12-04 DIAGNOSIS — H9190 Unspecified hearing loss, unspecified ear: Secondary | ICD-10-CM | POA: Diagnosis present

## 2018-12-04 DIAGNOSIS — E1122 Type 2 diabetes mellitus with diabetic chronic kidney disease: Secondary | ICD-10-CM | POA: Diagnosis present

## 2018-12-04 DIAGNOSIS — E875 Hyperkalemia: Secondary | ICD-10-CM | POA: Diagnosis not present

## 2018-12-04 DIAGNOSIS — R54 Age-related physical debility: Secondary | ICD-10-CM | POA: Diagnosis not present

## 2018-12-04 DIAGNOSIS — Z20828 Contact with and (suspected) exposure to other viral communicable diseases: Secondary | ICD-10-CM | POA: Diagnosis present

## 2018-12-04 DIAGNOSIS — Z7982 Long term (current) use of aspirin: Secondary | ICD-10-CM | POA: Diagnosis not present

## 2018-12-04 DIAGNOSIS — I442 Atrioventricular block, complete: Secondary | ICD-10-CM | POA: Diagnosis not present

## 2018-12-04 DIAGNOSIS — Z7984 Long term (current) use of oral hypoglycemic drugs: Secondary | ICD-10-CM | POA: Diagnosis not present

## 2018-12-04 LAB — GLUCOSE, CAPILLARY
Glucose-Capillary: 65 mg/dL — ABNORMAL LOW (ref 70–99)
Glucose-Capillary: 76 mg/dL (ref 70–99)
Glucose-Capillary: 145 mg/dL — ABNORMAL HIGH (ref 70–99)
Glucose-Capillary: 59 mg/dL — ABNORMAL LOW (ref 70–99)
Glucose-Capillary: 109 mg/dL — ABNORMAL HIGH (ref 70–99)
Glucose-Capillary: 160 mg/dL — ABNORMAL HIGH (ref 70–99)

## 2018-12-04 LAB — BASIC METABOLIC PANEL
Anion gap: 12 (ref 5–15)
BUN: 20 mg/dL (ref 8–23)
CO2: 25 mmol/L (ref 22–32)
Calcium: 9.4 mg/dL (ref 8.9–10.3)
Chloride: 106 mmol/L (ref 98–111)
Creatinine, Ser: 1.29 mg/dL — ABNORMAL HIGH (ref 0.61–1.24)
GFR calc Af Amer: 54 mL/min — ABNORMAL LOW (ref >60)
GFR calc non Af Amer: 47 mL/min — ABNORMAL LOW (ref >60)
Glucose, Bld: 89 mg/dL (ref 70–99)
Potassium: 5.4 mmol/L — ABNORMAL HIGH (ref 3.5–5.1)
Sodium: 143 mmol/L (ref 135–145)

## 2018-12-04 LAB — HEMOGLOBIN A1C
Hgb A1c MFr Bld: 6.1 % — ABNORMAL HIGH (ref 4.8–5.6)
Mean Plasma Glucose: 128.37 mg/dL

## 2018-12-04 LAB — CBC
HCT: 36.8 % — ABNORMAL LOW (ref 39.0–52.0)
Hemoglobin: 11.4 g/dL — ABNORMAL LOW (ref 13.0–17.0)
MCH: 28.4 pg (ref 26.0–34.0)
MCHC: 31 g/dL (ref 30.0–36.0)
MCV: 91.5 fL (ref 80.0–100.0)
Platelets: 242 10*3/uL (ref 150–400)
RBC: 4.02 MIL/uL — ABNORMAL LOW (ref 4.22–5.81)
RDW: 14.3 % (ref 11.5–15.5)
WBC: 6.5 10*3/uL (ref 4.0–10.5)
nRBC: 0 % (ref 0.0–0.2)

## 2018-12-04 LAB — VITAMIN B12: Vitamin B-12: 1162 pg/mL — ABNORMAL HIGH (ref 180–914)

## 2018-12-04 LAB — TSH: TSH: 0.76 u[IU]/mL (ref 0.350–4.500)

## 2018-12-04 MED ORDER — HYDRALAZINE HCL 20 MG/ML IJ SOLN
10.0000 mg | Freq: Four times a day (QID) | INTRAMUSCULAR | Status: DC | PRN
  Administered 2018-12-04: 10 mg via INTRAVENOUS
  Filled 2018-12-04: qty 1

## 2018-12-04 MED ORDER — SODIUM CHLORIDE 0.9 % IV SOLN
INTRAVENOUS | Status: DC
  Administered 2018-12-04 – 2018-12-05 (×2): via INTRAVENOUS

## 2018-12-04 MED ORDER — HALOPERIDOL LACTATE 5 MG/ML IJ SOLN
2.0000 mg | Freq: Four times a day (QID) | INTRAMUSCULAR | Status: DC | PRN
  Administered 2018-12-04: 2 mg via INTRAVENOUS
  Filled 2018-12-04: qty 1

## 2018-12-04 NOTE — Progress Notes (Addendum)
PROGRESS NOTE    Alexander Powell   VPX:[id number]  DOB: [birth year]  DOA: 12/03/2018 [facility]CP: [facility]   Brief Narrative:  Alexander Powell is a 85 y.o. male with medical history significant for first-degree AV block, T2DM, HTN, HLD, CKD Stage III, and hearing loss who was sent to the ED by his PCP for bradycardia.  The patient presents for confusion, loss of energy and weight loss from his PCP's office where he was noted to be bradycardic. Noted to be oriented to self only in ED EKG > A flutter with HR in 30s EP eval requested but pacer deferred due to confusion.  UA > + nitrites, large leukocytes Started on Ceftriaxone  COVID neg  Subjective: Pleasantly confused. Has no complaints.  I have spoken with his wife today who has noted over the past couple of months that he has been "acting different". He has required a lot of encouragement to complete his meals, behaving in an angry manner which is unlike him, he has been sleeping a lot more for example he would go back to bed after breakfast and had no energy. He has not been driving for at least 2 years now and does not go anywhere alone for this amount of time.     Assessment & Plan:   Principal Problem:   Acute metabolic encephalopathy - based on discussion with his wife, it appears that he may have underlying dementia   - check B12 and RPR - treating possible UTI - follow for behavioral disturbances  Active Problems:    UTI (urinary tract infection)? - he has had frequent micturition for about 6 months now has had to wear depends- no blood noted on his depends per wife - await urine culture- on Ceftriaxone    Bradycardia - HR improves to 70 at times -  EP has re-evaluated him today and does not feel he needs a pacemaker    Hypertension - cont Hydralazine    Hyperlipidemia - cont Pravachol    DM (diabetes mellitus), type 2  - his wife notes that his sugars have been 70-90s when she checks them - CBG 65 this  AM    CKD (chronic kidney disease) stage 3, GFR 30-59 ml/min   - Cr appears to be stable  Mild hyperkalemia - ~ 5.3 - 5.4- follow- not on ARB- recheck tomorrow- can consider Lokelma if it rises - ? If due to dehydration  Dehydration - start NS- wife states he does not want to eat or drink much anymore  Time spent in minutes: 45- long discussion with wife DVT prophylaxis: heparin Code Status: Full code Family Communication: his wife Disposition Plan: home likely tomorrow Consultants:   EP Procedures:   none Antimicrobials:  Anti-infectives (From admission, onward)   Start     Dose/Rate Route Frequency Ordered Stop   12/04/18 0500  cefTRIAXone (ROCEPHIN) 1 g in sodium chloride 0.9 % 100 mL IVPB     1 g 200 mL/hr over 30 Minutes Intravenous Every 24 hours 12/03/18 2217     12/03/18 2115  cefTRIAXone (ROCEPHIN) 1 g in sodium chloride 0.9 % 100 mL IVPB     1 g 200 mL/hr over 30 Minutes Intravenous  Once 12/03/18 2109 12/03/18 2221       Objective: Vitals:   12/03/18 2200 12/03/18 2315 12/04/18 0040 12/04/18 0542  BP: (!) 187/69 (!) 198/64 (!) 178/50 (!) 173/47  Pulse: (!) 101 (!) 42 (!) 36 (!) 39  Resp: 18 16 17  19  Temp:    97.8 F (36.6 C)  TempSrc:    Axillary  SpO2:  99% 95% 98%  Weight:    57.9 kg  Height:        Intake/Output Summary (Last 24 hours) at 12/04/2018 1052 Last data filed at 12/04/2018 0900 Gross per 24 hour  Intake 940 ml  Output 300 ml  Net 640 ml   Filed Weights   12/03/18 1218 12/04/18 0542  Weight: 74.8 kg 57.9 kg    Examination: General exam: Appears comfortable  HEENT: PERRLA, oral mucosa moist, no sclera icterus or thrush Respiratory system: Clear to auscultation. Respiratory effort normal. Cardiovascular system: S1 & S2 heard, RRR.   Gastrointestinal system: Abdomen soft, non-tender, nondistended. Normal bowel sounds. Central nervous system: Alert and oriented only to person. No focal neurological deficits. Extremities: No  cyanosis, clubbing or edema Skin: No rashes or ulcers Psychiatry:  Mood & affect appropriate.   Data Reviewed: I have personally reviewed following labs and imaging studies  CBC: Recent Labs  Lab 12/03/18 1315 12/04/18 0500  WBC 6.4 6.5  NEUTROABS 3.7  --   HGB 11.8* 11.4*  HCT 39.7 36.8*  MCV 94.3 91.5  PLT 251 242   Basic Metabolic Panel: Recent Labs  Lab 12/03/18 1315 12/04/18 0500  NA 142 143  K 5.3* 5.4*  CL 108 106  CO2 23 25  GLUCOSE 93 89  BUN 25* 20  CREATININE 1.48* 1.29*  CALCIUM 9.6 9.4   GFR: Estimated Creatinine Clearance: 28.1 mL/min (A) (by C-G formula based on SCr of 1.29 mg/dL (H)). Liver Function Tests: No results for input(s): AST, ALT, ALKPHOS, BILITOT, PROT, ALBUMIN in the last 168 hours. No results for input(s): LIPASE, AMYLASE in the last 168 hours. No results for input(s): AMMONIA in the last 168 hours. Coagulation Profile: Recent Labs  Lab 12/03/18 1315  INR 1.1   Cardiac Enzymes: Recent Labs  Lab 12/03/18 1315 12/03/18 2020  TROPONINI <0.03 <0.03   BNP (last 3 results) No results for input(s): PROBNP in the last 8760 hours. HbA1C: No results for input(s): HGBA1C in the last 72 hours. CBG: Recent Labs  Lab 12/03/18 2330 12/04/18 0731 12/04/18 0825  GLUCAP 103* 65* 76   Lipid Profile: No results for input(s): CHOL, HDL, LDLCALC, TRIG, CHOLHDL, LDLDIRECT in the last 72 hours. Thyroid Function Tests: Recent Labs    12/04/18 0500  TSH 0.760   Anemia Panel: No results for input(s): VITAMINB12, FOLATE, FERRITIN, TIBC, IRON, RETICCTPCT in the last 72 hours. Urine analysis:    Component Value Date/Time   COLORURINE YELLOW 12/03/2018 2030   APPEARANCEUR HAZY (A) 12/03/2018 2030   LABSPEC 1.009 12/03/2018 2030   PHURINE 7.0 12/03/2018 2030   GLUCOSEU NEGATIVE 12/03/2018 2030   HGBUR NEGATIVE 12/03/2018 2030   BILIRUBINUR NEGATIVE 12/03/2018 2030   KETONESUR NEGATIVE 12/03/2018 2030   PROTEINUR NEGATIVE 12/03/2018 2030    NITRITE POSITIVE (A) 12/03/2018 2030   LEUKOCYTESUR LARGE (A) 12/03/2018 2030   Sepsis Labs: @LABRCNTIP (procalcitonin:4,lacticidven:4) ) Recent Results (from the past 240 hour(s))  SARS Coronavirus 2 (CEPHEID - Performed in [facility] hospital lab), Hosp Order     Status: None   Collection Time: 12/03/18  6:19 PM  Result Value Ref Range Status   SARS Coronavirus 2 NEGATIVE NEGATIVE Final    Comment: (NOTE) If result is NEGATIVE SARS-CoV-2 target nucleic acids are NOT DETECTED. The SARS-CoV-2 RNA is generally detectable in upper and lower  respiratory specimens during the acute phase of  infection. The lowest  concentration of SARS-CoV-2 viral copies this assay can detect is 250  copies / mL. A negative result does not preclude SARS-CoV-2 infection  and should not be used as the sole basis for treatment or other  patient management decisions.  A negative result may occur with  improper specimen collection / handling, submission of specimen other  than nasopharyngeal swab, presence of viral mutation(s) within the  areas targeted by this assay, and inadequate number of viral copies  (<250 copies / mL). A negative result must be combined with clinical  observations, patient history, and epidemiological information. If result is POSITIVE SARS-CoV-2 target nucleic acids are DETECTED. The SARS-CoV-2 RNA is generally detectable in upper and lower  respiratory specimens dur ing the acute phase of infection.  Positive  results are indicative of active infection with SARS-CoV-2.  Clinical  correlation with patient history and other diagnostic information is  necessary to determine patient infection status.  Positive results do  not rule out bacterial infection or co-infection with other viruses. If result is PRESUMPTIVE POSTIVE SARS-CoV-2 nucleic acids MAY BE PRESENT.   A presumptive positive result was obtained on the submitted specimen  and confirmed on repeat testing.  While 2019  novel coronavirus  (SARS-CoV-2) nucleic acids may be present in the submitted sample  additional confirmatory testing may be necessary for epidemiological  and / or clinical management purposes  to differentiate between  SARS-CoV-2 and other Sarbecovirus currently known to infect humans.  If clinically indicated additional testing with an alternate test  methodology [phone]) is advised. The SARS-CoV-2 RNA is generally  detectable in upper and lower respiratory sp ecimens during the acute  phase of infection. The expected result is Negative. Fact Sheet for Patients:  BoilerBrush.com.cyhttps://www.fda.gov/media/136312/download Fact Sheet for Healthcare Providers: https://pope.com/https://www.fda.gov/media/136313/download This test is not yet approved or cleared by the [place] FDA and has been authorized for detection and/or diagnosis of SARS-CoV-2 by FDA under an Emergency Use Authorization (EUA).  This EUA will remain in effect (meaning this test can be used) for the duration of the COVID-19 declaration under Section 564(b)(1) of the Act, 21 U.S.C. section 360bbb-3(b)(1), unless the authorization is terminated or revoked sooner. Performed at [facility], [address] [place], [place] [address]          Radiology Studies: Ct Head Wo Contrast  Result Date: 12/03/2018 CLINICAL DATA:  Altered LOC EXAM: CT HEAD WITHOUT CONTRAST TECHNIQUE: Contiguous axial images were obtained from the base of the skull through the vertex without intravenous contrast. COMPARISON:  None. FINDINGS: Brain: No acute territorial infarction, hemorrhage, or intracranial mass. Moderate atrophy. Moderate hypodensity within the periventricular white matter, likely small vessel ischemic disease. Mildly prominent ventricles, felt secondary to atrophy. Mild cerebellar atrophy. Vascular: No hyperdense vessels.  Carotid vascular calcification. Skull: Normal. Negative for fracture or focal lesion. Sinuses/Orbits: Mucosal thickening within  the ethmoid sinuses. Retention cyst in the right sphenoid sinus. Other: None IMPRESSION: 1. No CT evidence for acute intracranial abnormality. 2. Atrophy and small vessel ischemic changes of the white matter Electronically Signed   By: Jasmine PangKim  Fujinaga M.D.   On: 12/03/2018 19:22   Dg Chest Port 1 View  Result Date: 12/03/2018 CLINICAL DATA:  Altered mental status EXAM: PORTABLE CHEST 1 VIEW COMPARISON:  04/13/2015 FINDINGS: Incomplete inclusion of the left base. Right lung is clear. Hazy opacity left base may be due to mild atelectasis. Heart size within normal limits. No pneumothorax IMPRESSION: Incomplete inclusion of left lung  base. Possible hazy atelectasis at the left base Electronically Signed   By: Jasmine Pang M.D.   On: 12/03/2018 19:27      Scheduled Meds: . aspirin  81 mg Oral QPM  . heparin  5,000 Units Subcutaneous Q8H  . hydrALAZINE  25 mg Oral BID  . insulin aspart  0-9 Units Subcutaneous TID WC  . pravastatin  80 mg Oral QPC lunch  . sodium chloride flush  3 mL Intravenous Q12H   Continuous Infusions: . cefTRIAXone (ROCEPHIN)  IV 1 g (12/04/18 0524)     LOS: 0 days      Calvert Cantor, MD [facility] Pager: [email or web address] Password [email or web address] 12/04/2018, 10:52 AM

## 2018-12-04 NOTE — Progress Notes (Signed)
0800 CBG 65 pt alert able to drink 230 ml orange juice 0900 CBG 76 pt sitting up eating breakfast.

## 2018-12-04 NOTE — Progress Notes (Signed)
Pt confused attempting to get out of bed frequently, tele sitter in use, sitting in chair at present with chair alarm activated, will continue to monitor for safety closely.

## 2018-12-04 NOTE — Progress Notes (Signed)
Progress Note  Patient Name: Alexander Powell [place] Date of Encounter: 12/04/2018  Primary Cardiologist: No primary care provider on file.   Subjective   Remains pleasantly confused  Inpatient Medications    Scheduled Meds: . aspirin  81 mg Oral QPM  . heparin  5,000 Units Subcutaneous Q8H  . hydrALAZINE  25 mg Oral BID  . insulin aspart  0-9 Units Subcutaneous TID WC  . pravastatin  80 mg Oral QPC lunch  . sodium chloride flush  3 mL Intravenous Q12H   Continuous Infusions: . cefTRIAXone (ROCEPHIN)  IV 1 g (12/04/18 0524)   PRN Meds: acetaminophen **OR** acetaminophen   Vital Signs    Vitals:   12/03/18 2200 12/03/18 2315 12/04/18 0040 12/04/18 0542  BP: (!) 187/69 (!) 198/64 (!) 178/50 (!) 173/47  Pulse: (!) 101 (!) 42 (!) 36 (!) 39  Resp: 18 16 17 19   Temp:    97.8 F (36.6 C)  TempSrc:    Axillary  SpO2:  99% 95% 98%  Weight:    57.9 kg  Height:        Intake/Output Summary (Last 24 hours) at 12/04/2018 [id number] Last data filed at 12/04/2018 0524 Gross per 24 hour  Intake 820 ml  Output 300 ml  Net 520 ml   Filed Weights   12/03/18 1218 12/04/18 0542  Weight: 74.8 kg 57.9 kg    Telemetry    Atrial flutter with a slow/controlled VR 35-70 - Personally Reviewed  ECG    none - Personally Reviewed  Physical Exam   GEN: No acute distress.   Neck: No JVD Cardiac: IRRR Respiratory: no increased work of breathing GI:  non-distended  MS: No edema; No deformity. Neuro:  Nonfocal but confused   Labs    Chemistry Recent Labs  Lab 12/03/18 1315 12/04/18 0500  NA 142 143  K 5.3* 5.4*  CL 108 106  CO2 23 25  GLUCOSE 93 89  BUN 25* 20  CREATININE 1.48* 1.29*  CALCIUM 9.6 9.4  GFRNONAA 40* 47*  GFRAA 46* 54*  ANIONGAP 11 12     Hematology Recent Labs  Lab 12/03/18 1315 12/04/18 0500  WBC 6.4 6.5  RBC 4.21* 4.02*  HGB 11.8* 11.4*  HCT 39.7 36.8*  MCV 94.3 91.5  MCH 28.0 28.4  MCHC 29.7* 31.0  RDW 14.5 14.3  PLT 251 242    Cardiac  Enzymes Recent Labs  Lab 12/03/18 1315 12/03/18 2020  TROPONINI <0.03 <0.03   No results for input(s): TROPIPOC in the last 168 hours.   BNPNo results for input(s): BNP, PROBNP in the last 168 hours.   DDimer No results for input(s): DDIMER in the last 168 hours.   Radiology    Ct Head Wo Contrast  Result Date: 12/03/2018 CLINICAL DATA:  Altered LOC EXAM: CT HEAD WITHOUT CONTRAST TECHNIQUE: Contiguous axial images were obtained from the base of the skull through the vertex without intravenous contrast. COMPARISON:  None. FINDINGS: Brain: No acute territorial infarction, hemorrhage, or intracranial mass. Moderate atrophy. Moderate hypodensity within the periventricular white matter, likely small vessel ischemic disease. Mildly prominent ventricles, felt secondary to atrophy. Mild cerebellar atrophy. Vascular: No hyperdense vessels.  Carotid vascular calcification. Skull: Normal. Negative for fracture or focal lesion. Sinuses/Orbits: Mucosal thickening within the ethmoid sinuses. Retention cyst in the right sphenoid sinus. Other: None IMPRESSION: 1. No CT evidence for acute intracranial abnormality. 2. Atrophy and small vessel ischemic changes of the white matter Electronically Signed   By: Selena BattenKim  Jake Samples M.D.   On: 12/03/2018 19:22   Dg Chest Port 1 View  Result Date: 12/03/2018 CLINICAL DATA:  Altered mental status EXAM: PORTABLE CHEST 1 VIEW COMPARISON:  04/13/2015 FINDINGS: Incomplete inclusion of the left base. Right lung is clear. Hazy opacity left base may be due to mild atelectasis. Heart size within normal limits. No pneumothorax IMPRESSION: Incomplete inclusion of left lung base. Possible hazy atelectasis at the left base Electronically Signed   By: Jasmine Pang M.D.   On: 12/03/2018 19:27    Cardiac Studies   none  Patient Profile     85 y.o. male admitted with failure to thrive, altered mental status and atrial flutter with a slow/controlled VR  Assessment & Plan    1. CHB  - his conduction is better today. At rest he is in the high 30's and with exertion (trying to climb out of bed) goes into the 70 range. No PPM is recommended 2. Atrial flutter - his rate is controlled. He is not a candidate for systemic anti-coagulation. 3. Confusion/altered mental status - I suspect he has fairly severe dementia. I cannot see any improvement since yesterday. He is not a candidate for PPM insertion.  4. Disp. - I would not work up any cardiac issues further. I suspect hospice / palliative care is most appropriate. If he has significant mental improvement please call us back.   [facility] will sign off.   Medication Recommendations:  Avoid all AV nodal blocking drugs Other recommendations (labs, testing, etc):  none Follow up as an outpatient:  None indicated  For questions or updates, please contact [facility] Please consult [email or web address] for contact info under Cardiology/STEMI.      Signed, Lewayne Bunting, MD  12/04/2018, 8:22 AM  Patient ID: Alexander Baker, male   DOB: [birth year], 85 y.o.   MRN: [id number]

## 2018-12-05 DIAGNOSIS — R54 Age-related physical debility: Secondary | ICD-10-CM

## 2018-12-05 DIAGNOSIS — E162 Hypoglycemia, unspecified: Secondary | ICD-10-CM

## 2018-12-05 DIAGNOSIS — G9340 Encephalopathy, unspecified: Secondary | ICD-10-CM

## 2018-12-05 DIAGNOSIS — E860 Dehydration: Secondary | ICD-10-CM

## 2018-12-05 LAB — GLUCOSE, CAPILLARY
Glucose-Capillary: 84 mg/dL (ref 70–99)
Glucose-Capillary: 189 mg/dL — ABNORMAL HIGH (ref 70–99)

## 2018-12-05 LAB — BASIC METABOLIC PANEL
Anion gap: 15 (ref 5–15)
BUN: 17 mg/dL (ref 8–23)
CO2: 24 mmol/L (ref 22–32)
Calcium: 9.3 mg/dL (ref 8.9–10.3)
Chloride: 104 mmol/L (ref 98–111)
Creatinine, Ser: 1.19 mg/dL (ref 0.61–1.24)
GFR calc Af Amer: 60 mL/min — ABNORMAL LOW (ref >60)
GFR calc non Af Amer: 52 mL/min — ABNORMAL LOW (ref >60)
Glucose, Bld: 99 mg/dL (ref 70–99)
Potassium: 4 mmol/L (ref 3.5–5.1)
Sodium: 143 mmol/L (ref 135–145)

## 2018-12-05 MED ORDER — GLUCERNA SHAKE PO LIQD: 237.0000 mL | Freq: Three times a day (TID) | ORAL | Status: DC

## 2018-12-05 MED ORDER — GLIPIZIDE ER 2.5 MG PO TB24: 2.5000 mg | ORAL_TABLET | Freq: Every day | ORAL | Status: AC

## 2018-12-05 MED ORDER — CEFUROXIME AXETIL 500 MG PO TABS: 500.0000 mg | ORAL_TABLET | Freq: Two times a day (BID) | ORAL | 0 refills | Status: AC

## 2018-12-05 MED ORDER — GLUCERNA SHAKE PO LIQD: 237.0000 mL | Freq: Three times a day (TID) | ORAL | 0 refills | Status: AC

## 2018-12-05 NOTE — Progress Notes (Signed)
Patient became combative when trying to get him to stay in bed and tried to kick and punch me, medicated with Haldol 2 mg IV and [facility] notified, will continue to monitor.

## 2018-12-05 NOTE — Discharge Summary (Addendum)
Physician Discharge Summary  Alexander Powell ZOX:[id number] DOB: May 28, 1921 DOA: 12/03/2018  PCP: [facility]  Admit date: 12/03/2018 Discharge date: 12/05/2018  Admitted From: home  Disposition:  home   Recommendations for Outpatient Follow-up:  1. F/u for weight loss and dehydration please 2. Recommend Neuro/psych eval for dementia 3. Recommend urology eval for probable BPH   Home Health:  Re-ordered   Discharge Condition:  stable CODE STATUS:  Full code   Diet recommendation:  Diabetic, encourage liquids Consultations:  EP    Discharge Diagnoses:  Principal Problem:   Acute metabolic encephalopathy Active Problems:   Dehydration   Hypoglycemia   Bradycardia   UTI (urinary tract infection)   Hypertension   Hyperlipidemia   DM (diabetes mellitus), type 2 (HCC)   CKD (chronic kidney disease) stage 3, GFR 30-59 ml/min (HCC)      Brief Summary: Alexander Powell is a 85 y.o.malewithwith medical history significant forfirst-degree AV block,T2DM, HTN, HLD, CKD Stage III, and hearing loss whowas sent to the ED by his PCP for bradycardia.  The patient presents for confusion, loss of energy and weight loss from his PCP's office where he was noted to be bradycardic. Noted to be oriented to self only in ED EKG > A flutter with HR in 30s EP eval requested but pacer deferred due to confusion.  UA > + nitrites, large leukocytes Started on Ceftriaxone  COVID neg I have spoken with his wife today who has noted over the past couple of months that he has been "acting different". He has required a lot of encouragement to complete his meals, behaving in an angry manner which is unlike him, he has been sleeping a lot more for example he would go back to bed after breakfast and had no energy. He has not been driving for at least 2 years now and has not been able to go anywhere alone for this amount of time.   Hospital Course:  Principal Problem:   Acute metabolic  encephalopathy - based on discussion with his wife, it appears that he may have underlying dementia   -TSh normal, B12 is slightly elevated   - treating  UTI - he was agitated and trying punch and kick the staff while trying to get out of bed last night and he did need a dose of Haldol - per wife, he gets angry ocassioanally which is unlike him but does not get significantly agitated or stay up at night- I have not ordered any antipsychotics or sedatives for home use  Active Problems:    UTI (urinary tract infection)? - he has had frequent micturition for about 6 months now has had to wear depends- no blood noted on his depends per wife -   urine culture showing > 100K gr neg rods- on Ceftriaxone- change to oral and continue for 7 days- I will keep an eye out for the final culture results    A-flutter with Bradycardia - HR as low as 30-40s prompted an EP eval in the ED- it was later noted that improves to 70 at times -  EP has evaluated him and does not feel he needs a pacer at this point- they also note that with his confusion, he is not a candidate for a pacemaker and should continue on a baby aspirin for anticoagulation    Hypertension - cont Hydralazine which will control BP without dropping HR    DM (diabetes mellitus), type 2  - his wife notes that his sugars have  been 70-90s when she checks them - CBG 65 and then 59 yesterday- per wife, sugars are higher at home especially when they tried to stop Metformin - she was told by his provider to resume it   - A1c 6.1  Dehydration CKD (chronic kidney disease) stage 3, GFR 30-59 ml/min   - BUN 25/ Cr 1.48 on admission has improved to 17/1.19 with IVF - add Glucerna TID for poor PO intake  Mild hyperkalemia - ~ 5.3 - 5.4-  not on ARB-  - IVF started yesterday and Cr and K have both improved- I suspect K was elevated due to AKI from dehydration- discussed increasing oral intake with family    Hyperlipidemia - cont  Pravachol    Discharge Exam: Vitals:   12/04/18 2246 12/05/18 0950  BP: (!) 176/50 (!) 139/96  Pulse:    Resp:    Temp:    SpO2:     Vitals:   12/04/18 1635 12/04/18 2151 12/04/18 2246 12/05/18 0950  BP: (!) 160/92 (!) 176/50 (!) 176/50 (!) 139/96  Pulse: 60 (!) 46    Resp: 17 (!) 22    Temp: 97.8 F (36.6 C) (!) 97.5 F (36.4 C)    TempSrc: Oral Oral    SpO2: 97% 95%    Weight:      Height:        General exam: Appears comfortable  HEENT: PERRLA, oral mucosa moist, no sclera icterus or thrush Respiratory system: Clear to auscultation. Respiratory effort normal. Cardiovascular system: S1 & S2 heard,  No murmurs  Gastrointestinal system: Abdomen soft, non-tender, nondistended. Normal bowel sounds   Central nervous system: Alert and oriented only to peson. No focal neurological deficits. Extremities: No cyanosis, clubbing or edema Skin: No rashes or ulcers Psychiatry:  Mood & affect appropriate.    Discharge Instructions  Discharge Instructions    Diet - low sodium heart healthy   Complete by:  As directed    Diet Carb Modified   Complete by:  As directed    Encourage fluids- give at least 1.5 L of fluids daily to prevent dehydration.   Face-to-face encounter (required for Medicare/Medicaid patients)   Complete by:  As directed    Please resume his home health   The encounter with the patient was in whole, or in part, for the following medical condition, which is the primary reason for home health care:  acute encephalopathy, UTI   I certify that, based on my findings, the following services are medically necessary home health services:  Physical therapy   Reason for Medically Necessary Home Health Services:  Therapy- Therapeutic Exercises to Increase Strength and Endurance   My clinical findings support the need for the above services:  Unable to leave home safely without assistance and/or assistive device   Further, I certify that my clinical findings support that  this patient is homebound due to:  Unable to leave home safely without assistance   Home Health   Complete by:  As directed    To provide the following care/treatments:   PT OT     Increase activity slowly   Complete by:  As directed    Increase activity slowly   Complete by:  As directed      Allergies as of 12/05/2018   No Known Allergies     Medication List    STOP taking these medications   glipiZIDE 2.5 MG 24 hr tablet Commonly known as:  GLUCOTROL XL     TAKE  these medications   aspirin 81 MG tablet Take 81 mg by mouth every evening.   feeding supplement (GLUCERNA SHAKE) Liqd Take 237 mLs by mouth 3 (three) times daily between meals.   hydrALAZINE 25 MG tablet Commonly known as:  APRESOLINE Take 25 mg by mouth 2 (two) times a day.   metFORMIN 500 MG tablet Commonly known as:  GLUCOPHAGE Take 500 mg by mouth every morning.   multivitamin with minerals Tabs tablet Take 1 tablet by mouth daily.   pravastatin 80 MG tablet Commonly known as:  PRAVACHOL Take 80 mg by mouth daily after lunch.      Follow-up Information    [facility] Follow up.   Why:  for issues with urine leaking and recent UTI Contact information: [address] [place] [address] [phone]       [facility] Follow up in 1 week(s).   Specialty:  Rheumatology Why:  by next Monday. Discuss evaluation for dementia and a urologist please.  Contact information: [address] [place] [address] [phone]          No Known Allergies   Procedures/Studies:    Ct Head Wo Contrast  Result Date: 12/03/2018 CLINICAL DATA:  Altered LOC EXAM: CT HEAD WITHOUT CONTRAST TECHNIQUE: Contiguous axial images were obtained from the base of the skull through the vertex without intravenous contrast. COMPARISON:  None. FINDINGS: Brain: No acute territorial infarction, hemorrhage, or intracranial mass. Moderate atrophy. Moderate  hypodensity within the periventricular white matter, likely small vessel ischemic disease. Mildly prominent ventricles, felt secondary to atrophy. Mild cerebellar atrophy. Vascular: No hyperdense vessels.  Carotid vascular calcification. Skull: Normal. Negative for fracture or focal lesion. Sinuses/Orbits: Mucosal thickening within the ethmoid sinuses. Retention cyst in the right sphenoid sinus. Other: None IMPRESSION: 1. No CT evidence for acute intracranial abnormality. 2. Atrophy and small vessel ischemic changes of the white matter Electronically Signed   By: Jasmine Pang M.D.   On: 12/03/2018 19:22   Dg Chest Port 1 View  Result Date: 12/03/2018 CLINICAL DATA:  Altered mental status EXAM: PORTABLE CHEST 1 VIEW COMPARISON:  04/13/2015 FINDINGS: Incomplete inclusion of the left base. Right lung is clear. Hazy opacity left base may be due to mild atelectasis. Heart size within normal limits. No pneumothorax IMPRESSION: Incomplete inclusion of left lung base. Possible hazy atelectasis at the left base Electronically Signed   By: Jasmine Pang M.D.   On: 12/03/2018 19:27     The results of significant diagnostics from this hospitalization (including imaging, microbiology, ancillary and laboratory) are listed below for reference.     Microbiology: Recent Results (from the past 240 hour(s))  SARS Coronavirus 2 (CEPHEID - Performed in [facility] hospital lab), Hosp Order     Status: None   Collection Time: 12/03/18  6:19 PM  Result Value Ref Range Status   SARS Coronavirus 2 NEGATIVE NEGATIVE Final    Comment: (NOTE) If result is NEGATIVE SARS-CoV-2 target nucleic acids are NOT DETECTED. The SARS-CoV-2 RNA is generally detectable in upper and lower  respiratory specimens during the acute phase of infection. The lowest  concentration of SARS-CoV-2 viral copies this assay can detect is 250  copies / mL. A negative result does not preclude SARS-CoV-2 infection  and should not be used as the sole  basis for treatment or other  patient management decisions.  A negative result may occur with  improper specimen collection / handling, submission of specimen other  than nasopharyngeal swab, presence of viral mutation(s) within the  areas targeted by this assay, and inadequate number of viral copies  (<250 copies / mL). A negative result must be combined with clinical  observations, patient history, and epidemiological information. If result is POSITIVE SARS-CoV-2 target nucleic acids are DETECTED. The SARS-CoV-2 RNA is generally detectable in upper and lower  respiratory specimens dur ing the acute phase of infection.  Positive  results are indicative of active infection with SARS-CoV-2.  Clinical  correlation with patient history and other diagnostic information is  necessary to determine patient infection status.  Positive results do  not rule out bacterial infection or co-infection with other viruses. If result is PRESUMPTIVE POSTIVE SARS-CoV-2 nucleic acids MAY BE PRESENT.   A presumptive positive result was obtained on the submitted specimen  and confirmed on repeat testing.  While 2019 novel coronavirus  (SARS-CoV-2) nucleic acids may be present in the submitted sample  additional confirmatory testing may be necessary for epidemiological  and / or clinical management purposes  to differentiate between  SARS-CoV-2 and other Sarbecovirus currently known to infect humans.  If clinically indicated additional testing with an alternate test  methodology [phone]([id number]) is advised. The SARS-CoV-2 RNA is generally  detectable in upper and lower respiratory sp ecimens during the acute  phase of infection. The expected result is Negative. Fact Sheet for Patients:  BoilerBrush.com.cyhttps://www.fda.gov/media/136312/download Fact Sheet for Healthcare Providers: https://pope.com/https://www.fda.gov/media/136313/download This test is not yet approved or cleared by the [place] FDA and has been authorized for detection  and/or diagnosis of SARS-CoV-2 by FDA under an Emergency Use Authorization (EUA).  This EUA will remain in effect (meaning this test can be used) for the duration of the COVID-19 declaration under Section 564(b)(1) of the Act, 21 U.S.C. section 360bbb-3(b)(1), unless the authorization is terminated or revoked sooner. Performed at [facility] [place] [facility], [address] [place], [place] [address]   Urine culture     Status: Abnormal (Preliminary result)   Collection Time: 12/03/18  8:30 PM  Result Value Ref Range Status   Specimen Description URINE, CLEAN CATCH  Final   Special Requests   Final    NONE Performed at [facility], [address] [place], [place] [address]    Culture >=100,000 COLONIES/mL GRAM NEGATIVE RODS (A)  Final   Report Status PENDING  Incomplete     Labs: BNP (last 3 results) No results for input(s): BNP in the last 8760 hours. Basic Metabolic Panel: Recent Labs  Lab 12/03/18 1315 12/04/18 0500 12/05/18 0607  NA 142 143 143  K 5.3* 5.4* 4.0  CL 108 106 104  CO2 23 25 24   GLUCOSE 93 89 99  BUN 25* 20 17  CREATININE 1.48* 1.29* 1.19  CALCIUM 9.6 9.4 9.3   Liver Function Tests: No results for input(s): AST, ALT, ALKPHOS, BILITOT, PROT, ALBUMIN in the last 168 hours. No results for input(s): LIPASE, AMYLASE in the last 168 hours. No results for input(s): AMMONIA in the last 168 hours. CBC: Recent Labs  Lab 12/03/18 1315 12/04/18 0500  WBC 6.4 6.5  NEUTROABS 3.7  --   HGB 11.8* 11.4*  HCT 39.7 36.8*  MCV 94.3 91.5  PLT 251 242   Cardiac Enzymes: Recent Labs  Lab 12/03/18 1315 12/03/18 2020  TROPONINI <0.03 <0.03   BNP: Invalid input(s): POCBNP CBG: Recent Labs  Lab 12/04/18 1621 12/04/18 1805 12/04/18 2156 12/05/18 0749 12/05/18 1133  GLUCAP 59* 109* 160* 84 189*   D-Dimer  No results for input(s): DDIMER in the last 72 hours. Hgb A1c Recent Labs    12/04/18 1136  HGBA1C 6.1*   Lipid Profile No results for input(s):  CHOL, HDL, LDLCALC, TRIG, CHOLHDL, LDLDIRECT in the last 72 hours. Thyroid function studies Recent Labs    12/04/18 0500  TSH 0.760   Anemia work up Recent Labs    12/04/18 1136  VITAMINB12 1,162*   Urinalysis    Component Value Date/Time   COLORURINE YELLOW 12/03/2018 2030   APPEARANCEUR HAZY (A) 12/03/2018 2030   LABSPEC 1.009 12/03/2018 2030   PHURINE 7.0 12/03/2018 2030   GLUCOSEU NEGATIVE 12/03/2018 2030   HGBUR NEGATIVE 12/03/2018 2030   BILIRUBINUR NEGATIVE 12/03/2018 2030   KETONESUR NEGATIVE 12/03/2018 2030   PROTEINUR NEGATIVE 12/03/2018 2030   NITRITE POSITIVE (A) 12/03/2018 2030   LEUKOCYTESUR LARGE (A) 12/03/2018 2030   Sepsis Labs Invalid input(s): PROCALCITONIN,  WBC,  LACTICIDVEN Microbiology Recent Results (from the past 240 hour(s))  SARS Coronavirus 2 (CEPHEID - Performed in [facility] hospital lab), Hosp Order     Status: None   Collection Time: 12/03/18  6:19 PM  Result Value Ref Range Status   SARS Coronavirus 2 NEGATIVE NEGATIVE Final    Comment: (NOTE) If result is NEGATIVE SARS-CoV-2 target nucleic acids are NOT DETECTED. The SARS-CoV-2 RNA is generally detectable in upper and lower  respiratory specimens during the acute phase of infection. The lowest  concentration of SARS-CoV-2 viral copies this assay can detect is 250  copies / mL. A negative result does not preclude SARS-CoV-2 infection  and should not be used as the sole basis for treatment or other  patient management decisions.  A negative result may occur with  improper specimen collection / handling, submission of specimen other  than nasopharyngeal swab, presence of viral mutation(s) within the  areas targeted by this assay, and inadequate number of viral copies  (<250 copies / mL). A negative result must be combined with clinical  observations, patient history, and epidemiological information. If result is POSITIVE SARS-CoV-2 target nucleic acids are DETECTED. The SARS-CoV-2  RNA is generally detectable in upper and lower  respiratory specimens dur ing the acute phase of infection.  Positive  results are indicative of active infection with SARS-CoV-2.  Clinical  correlation with patient history and other diagnostic information is  necessary to determine patient infection status.  Positive results do  not rule out bacterial infection or co-infection with other viruses. If result is PRESUMPTIVE POSTIVE SARS-CoV-2 nucleic acids MAY BE PRESENT.   A presumptive positive result was obtained on the submitted specimen  and confirmed on repeat testing.  While 2019 novel coronavirus  (SARS-CoV-2) nucleic acids may be present in the submitted sample  additional confirmatory testing may be necessary for epidemiological  and / or clinical management purposes  to differentiate between  SARS-CoV-2 and other Sarbecovirus currently known to infect humans.  If clinically indicated additional testing with an alternate test  methodology [phone]) is advised. The SARS-CoV-2 RNA is generally  detectable in upper and lower respiratory sp ecimens during the acute  phase of infection. The expected result is Negative. Fact Sheet for Patients:  BoilerBrush.com.cy Fact Sheet for Healthcare Providers: https://pope.com/ This test is not yet approved or cleared by the [place] FDA and has been authorized for detection and/or diagnosis of SARS-CoV-2 by FDA under an Emergency Use Authorization (EUA).  This EUA will remain in effect (meaning this test can be used) for the duration of the  COVID-19 declaration under Section 564(b)(1) of the Act, 21 U.S.C. section 360bbb-3(b)(1), unless the authorization is terminated or revoked sooner. Performed at [facility], [address] [place], [place] [address]   Urine culture     Status: Abnormal (Preliminary result)   Collection Time: 12/03/18  8:30 PM  Result Value Ref Range Status    Specimen Description URINE, CLEAN CATCH  Final   Special Requests   Final    NONE Performed at [facility], [address] [place], [place] [address]    Culture >=100,000 COLONIES/mL GRAM NEGATIVE RODS (A)  Final   Report Status PENDING  Incomplete     Time coordinating discharge in minutes: 65  SIGNED:   Calvert Cantor, MD  [facility] 12/05/2018, 1:30 PM Pager   If 7PM-7AM, please contact night-coverage [email or web address] Password [email or web address]

## 2018-12-05 NOTE — Progress Notes (Signed)
Patient awake and attempting to get OOB and very irrate, attempted to punch and kick me, will stayin in room till I can get relief since I'm unable to get him to cooperate.

## 2018-12-05 NOTE — Discharge Instructions (Signed)
Please do no give Glipizide if his sugar is < 65. Change depends frequently if wet.  Ensure his is drinking at least 1.5 L of water or caffeine free liquids during the day.  Please take him to see a Urologist for his urinary leaking.  He should have a neuro/psych evaluation for his dementia.   You were cared for by a hospitalist during your hospital stay. If you have any questions about your discharge medications or the care you received while you were in the hospital after you are discharged, you can call the unit and asked to speak with the hospitalist on call if the hospitalist that took care of you is not available. Once you are discharged, your primary care physician will handle any further medical issues.   Please note that NO REFILLS for any discharge medications will be authorized once you are discharged, as it is imperative that you return to your primary care physician (or establish a relationship with a primary care physician if you do not have one) for your aftercare needs so that they can reassess your need for medications and monitor your lab values.  Please take all your medications with you for your next visit with your Primary MD. Please ask your Primary MD to get all Hospital records sent to his/her office. Please request your Primary MD to go over all hospital test results at the follow up.   If you experience worsening of your admission symptoms, develop shortness of breath, chest pain, suicidal or homicidal thoughts or a life threatening emergency, you must seek medical attention immediately by calling 911 or calling your MD.   Bonita Quin must read the complete instructions/literature along with all the possible adverse reactions/side effects for all the medicines you take including new medications that have been prescribed to you. Take new medicines after you have completely understood and accpet all the possible adverse reactions/side effects.    Do not drive when taking pain  medications or sedatives.     Do not take more than prescribed Pain, Sleep and Anxiety Medications   If you have smoked or chewed Tobacco in the last 2 yrs please stop. Stop any regular alcohol  and or recreational drug use.   Wear Seat belts while driving.

## 2018-12-06 LAB — URINE CULTURE: Culture: >=100000 — AB

## 2018-12-06 LAB — SYPHILIS: RPR W/REFLEX TO RPR TITER AND TREPONEMAL ANTIBODIES, TRADITIONAL SCREENING AND DIAGNOSIS ALGORITHM: RPR Ser Ql: NONREACTIVE

## 2018-12-07 DIAGNOSIS — E875 Hyperkalemia: Secondary | ICD-10-CM | POA: Diagnosis not present

## 2018-12-07 DIAGNOSIS — Z7984 Long term (current) use of oral hypoglycemic drugs: Secondary | ICD-10-CM | POA: Diagnosis not present

## 2018-12-07 DIAGNOSIS — R5383 Other fatigue: Secondary | ICD-10-CM | POA: Diagnosis not present

## 2018-12-07 DIAGNOSIS — N183 Chronic kidney disease, stage 3 (moderate): Secondary | ICD-10-CM | POA: Diagnosis not present

## 2018-12-07 DIAGNOSIS — E1122 Type 2 diabetes mellitus with diabetic chronic kidney disease: Secondary | ICD-10-CM | POA: Diagnosis not present

## 2018-12-07 DIAGNOSIS — I129 Hypertensive chronic kidney disease with stage 1 through stage 4 chronic kidney disease, or unspecified chronic kidney disease: Secondary | ICD-10-CM | POA: Diagnosis not present

## 2018-12-07 DIAGNOSIS — Z87891 Personal history of nicotine dependence: Secondary | ICD-10-CM | POA: Diagnosis not present

## 2018-12-07 DIAGNOSIS — Z9181 History of falling: Secondary | ICD-10-CM | POA: Diagnosis not present

## 2018-12-07 DIAGNOSIS — R2681 Unsteadiness on feet: Secondary | ICD-10-CM | POA: Diagnosis not present

## 2018-12-07 DIAGNOSIS — E7800 Pure hypercholesterolemia, unspecified: Secondary | ICD-10-CM | POA: Diagnosis not present

## 2018-12-08 DIAGNOSIS — Z9181 History of falling: Secondary | ICD-10-CM | POA: Diagnosis not present

## 2018-12-08 DIAGNOSIS — Z87891 Personal history of nicotine dependence: Secondary | ICD-10-CM | POA: Diagnosis not present

## 2018-12-08 DIAGNOSIS — E875 Hyperkalemia: Secondary | ICD-10-CM | POA: Diagnosis not present

## 2018-12-08 DIAGNOSIS — E7800 Pure hypercholesterolemia, unspecified: Secondary | ICD-10-CM | POA: Diagnosis not present

## 2018-12-08 DIAGNOSIS — R5383 Other fatigue: Secondary | ICD-10-CM | POA: Diagnosis not present

## 2018-12-08 DIAGNOSIS — Z7984 Long term (current) use of oral hypoglycemic drugs: Secondary | ICD-10-CM | POA: Diagnosis not present

## 2018-12-08 DIAGNOSIS — N183 Chronic kidney disease, stage 3 (moderate): Secondary | ICD-10-CM | POA: Diagnosis not present

## 2018-12-08 DIAGNOSIS — E1122 Type 2 diabetes mellitus with diabetic chronic kidney disease: Secondary | ICD-10-CM | POA: Diagnosis not present

## 2018-12-08 DIAGNOSIS — R2681 Unsteadiness on feet: Secondary | ICD-10-CM | POA: Diagnosis not present

## 2018-12-08 DIAGNOSIS — I129 Hypertensive chronic kidney disease with stage 1 through stage 4 chronic kidney disease, or unspecified chronic kidney disease: Secondary | ICD-10-CM | POA: Diagnosis not present

## 2018-12-09 DIAGNOSIS — Z7984 Long term (current) use of oral hypoglycemic drugs: Secondary | ICD-10-CM | POA: Diagnosis not present

## 2018-12-09 DIAGNOSIS — R5383 Other fatigue: Secondary | ICD-10-CM | POA: Diagnosis not present

## 2018-12-09 DIAGNOSIS — N183 Chronic kidney disease, stage 3 (moderate): Secondary | ICD-10-CM | POA: Diagnosis not present

## 2018-12-09 DIAGNOSIS — Z87891 Personal history of nicotine dependence: Secondary | ICD-10-CM | POA: Diagnosis not present

## 2018-12-09 DIAGNOSIS — Z9181 History of falling: Secondary | ICD-10-CM | POA: Diagnosis not present

## 2018-12-09 DIAGNOSIS — E875 Hyperkalemia: Secondary | ICD-10-CM | POA: Diagnosis not present

## 2018-12-09 DIAGNOSIS — E1122 Type 2 diabetes mellitus with diabetic chronic kidney disease: Secondary | ICD-10-CM | POA: Diagnosis not present

## 2018-12-09 DIAGNOSIS — R2681 Unsteadiness on feet: Secondary | ICD-10-CM | POA: Diagnosis not present

## 2018-12-09 DIAGNOSIS — E7800 Pure hypercholesterolemia, unspecified: Secondary | ICD-10-CM | POA: Diagnosis not present

## 2018-12-09 DIAGNOSIS — I129 Hypertensive chronic kidney disease with stage 1 through stage 4 chronic kidney disease, or unspecified chronic kidney disease: Secondary | ICD-10-CM | POA: Diagnosis not present

## 2018-12-10 DIAGNOSIS — E875 Hyperkalemia: Secondary | ICD-10-CM | POA: Diagnosis not present

## 2018-12-10 DIAGNOSIS — R5383 Other fatigue: Secondary | ICD-10-CM | POA: Diagnosis not present

## 2018-12-10 DIAGNOSIS — N183 Chronic kidney disease, stage 3 (moderate): Secondary | ICD-10-CM | POA: Diagnosis not present

## 2018-12-10 DIAGNOSIS — I129 Hypertensive chronic kidney disease with stage 1 through stage 4 chronic kidney disease, or unspecified chronic kidney disease: Secondary | ICD-10-CM | POA: Diagnosis not present

## 2018-12-10 DIAGNOSIS — E1122 Type 2 diabetes mellitus with diabetic chronic kidney disease: Secondary | ICD-10-CM | POA: Diagnosis not present

## 2018-12-10 DIAGNOSIS — Z9181 History of falling: Secondary | ICD-10-CM | POA: Diagnosis not present

## 2018-12-10 DIAGNOSIS — R2681 Unsteadiness on feet: Secondary | ICD-10-CM | POA: Diagnosis not present

## 2018-12-10 DIAGNOSIS — Z7984 Long term (current) use of oral hypoglycemic drugs: Secondary | ICD-10-CM | POA: Diagnosis not present

## 2018-12-10 DIAGNOSIS — E7800 Pure hypercholesterolemia, unspecified: Secondary | ICD-10-CM | POA: Diagnosis not present

## 2018-12-10 DIAGNOSIS — M6281 Muscle weakness (generalized): Secondary | ICD-10-CM | POA: Diagnosis not present

## 2018-12-10 DIAGNOSIS — Z87891 Personal history of nicotine dependence: Secondary | ICD-10-CM | POA: Diagnosis not present

## 2018-12-13 DIAGNOSIS — R269 Unspecified abnormalities of gait and mobility: Secondary | ICD-10-CM | POA: Diagnosis not present

## 2018-12-13 DIAGNOSIS — Z87891 Personal history of nicotine dependence: Secondary | ICD-10-CM | POA: Diagnosis not present

## 2018-12-13 DIAGNOSIS — Z7984 Long term (current) use of oral hypoglycemic drugs: Secondary | ICD-10-CM | POA: Diagnosis not present

## 2018-12-13 DIAGNOSIS — E875 Hyperkalemia: Secondary | ICD-10-CM | POA: Diagnosis not present

## 2018-12-13 DIAGNOSIS — E7800 Pure hypercholesterolemia, unspecified: Secondary | ICD-10-CM | POA: Diagnosis not present

## 2018-12-13 DIAGNOSIS — E1122 Type 2 diabetes mellitus with diabetic chronic kidney disease: Secondary | ICD-10-CM | POA: Diagnosis not present

## 2018-12-13 DIAGNOSIS — R5383 Other fatigue: Secondary | ICD-10-CM | POA: Diagnosis not present

## 2018-12-13 DIAGNOSIS — R2681 Unsteadiness on feet: Secondary | ICD-10-CM | POA: Diagnosis not present

## 2018-12-13 DIAGNOSIS — Z9181 History of falling: Secondary | ICD-10-CM | POA: Diagnosis not present

## 2018-12-13 DIAGNOSIS — I129 Hypertensive chronic kidney disease with stage 1 through stage 4 chronic kidney disease, or unspecified chronic kidney disease: Secondary | ICD-10-CM | POA: Diagnosis not present

## 2018-12-13 DIAGNOSIS — N183 Chronic kidney disease, stage 3 (moderate): Secondary | ICD-10-CM | POA: Diagnosis not present

## 2018-12-14 DIAGNOSIS — R001 Bradycardia, unspecified: Secondary | ICD-10-CM | POA: Diagnosis not present

## 2018-12-14 DIAGNOSIS — I10 Essential (primary) hypertension: Secondary | ICD-10-CM | POA: Diagnosis not present

## 2018-12-14 DIAGNOSIS — N390 Urinary tract infection, site not specified: Secondary | ICD-10-CM | POA: Diagnosis not present

## 2018-12-14 DIAGNOSIS — E118 Type 2 diabetes mellitus with unspecified complications: Secondary | ICD-10-CM | POA: Diagnosis not present

## 2018-12-15 DIAGNOSIS — Z9181 History of falling: Secondary | ICD-10-CM | POA: Diagnosis not present

## 2018-12-15 DIAGNOSIS — E1122 Type 2 diabetes mellitus with diabetic chronic kidney disease: Secondary | ICD-10-CM | POA: Diagnosis not present

## 2018-12-15 DIAGNOSIS — E875 Hyperkalemia: Secondary | ICD-10-CM | POA: Diagnosis not present

## 2018-12-15 DIAGNOSIS — Z7984 Long term (current) use of oral hypoglycemic drugs: Secondary | ICD-10-CM | POA: Diagnosis not present

## 2018-12-15 DIAGNOSIS — N183 Chronic kidney disease, stage 3 (moderate): Secondary | ICD-10-CM | POA: Diagnosis not present

## 2018-12-15 DIAGNOSIS — I129 Hypertensive chronic kidney disease with stage 1 through stage 4 chronic kidney disease, or unspecified chronic kidney disease: Secondary | ICD-10-CM | POA: Diagnosis not present

## 2018-12-15 DIAGNOSIS — E7800 Pure hypercholesterolemia, unspecified: Secondary | ICD-10-CM | POA: Diagnosis not present

## 2018-12-15 DIAGNOSIS — R5383 Other fatigue: Secondary | ICD-10-CM | POA: Diagnosis not present

## 2018-12-15 DIAGNOSIS — Z87891 Personal history of nicotine dependence: Secondary | ICD-10-CM | POA: Diagnosis not present

## 2018-12-15 DIAGNOSIS — R2681 Unsteadiness on feet: Secondary | ICD-10-CM | POA: Diagnosis not present

## 2018-12-16 ENCOUNTER — Telehealth: Payer: Self-pay

## 2018-12-16 DIAGNOSIS — I129 Hypertensive chronic kidney disease with stage 1 through stage 4 chronic kidney disease, or unspecified chronic kidney disease: Secondary | ICD-10-CM | POA: Diagnosis not present

## 2018-12-16 DIAGNOSIS — E7800 Pure hypercholesterolemia, unspecified: Secondary | ICD-10-CM | POA: Diagnosis not present

## 2018-12-16 DIAGNOSIS — Z7984 Long term (current) use of oral hypoglycemic drugs: Secondary | ICD-10-CM | POA: Diagnosis not present

## 2018-12-16 DIAGNOSIS — E875 Hyperkalemia: Secondary | ICD-10-CM | POA: Diagnosis not present

## 2018-12-16 DIAGNOSIS — Z87891 Personal history of nicotine dependence: Secondary | ICD-10-CM | POA: Diagnosis not present

## 2018-12-16 DIAGNOSIS — R2681 Unsteadiness on feet: Secondary | ICD-10-CM | POA: Diagnosis not present

## 2018-12-16 DIAGNOSIS — Z9181 History of falling: Secondary | ICD-10-CM | POA: Diagnosis not present

## 2018-12-16 DIAGNOSIS — R5383 Other fatigue: Secondary | ICD-10-CM | POA: Diagnosis not present

## 2018-12-16 DIAGNOSIS — E1122 Type 2 diabetes mellitus with diabetic chronic kidney disease: Secondary | ICD-10-CM | POA: Diagnosis not present

## 2018-12-16 DIAGNOSIS — N183 Chronic kidney disease, stage 3 (moderate): Secondary | ICD-10-CM | POA: Diagnosis not present

## 2018-12-16 NOTE — Telephone Encounter (Signed)
Patient would like to call back to schedule appointment. Very unsure if the appointment is needed.

## 2018-12-21 DIAGNOSIS — E1122 Type 2 diabetes mellitus with diabetic chronic kidney disease: Secondary | ICD-10-CM | POA: Diagnosis not present

## 2018-12-21 DIAGNOSIS — N183 Chronic kidney disease, stage 3 (moderate): Secondary | ICD-10-CM | POA: Diagnosis not present

## 2018-12-21 DIAGNOSIS — I129 Hypertensive chronic kidney disease with stage 1 through stage 4 chronic kidney disease, or unspecified chronic kidney disease: Secondary | ICD-10-CM | POA: Diagnosis not present

## 2018-12-21 DIAGNOSIS — Z9181 History of falling: Secondary | ICD-10-CM | POA: Diagnosis not present

## 2018-12-21 DIAGNOSIS — Z7984 Long term (current) use of oral hypoglycemic drugs: Secondary | ICD-10-CM | POA: Diagnosis not present

## 2018-12-21 DIAGNOSIS — R2681 Unsteadiness on feet: Secondary | ICD-10-CM | POA: Diagnosis not present

## 2018-12-21 DIAGNOSIS — Z87891 Personal history of nicotine dependence: Secondary | ICD-10-CM | POA: Diagnosis not present

## 2018-12-21 DIAGNOSIS — R5383 Other fatigue: Secondary | ICD-10-CM | POA: Diagnosis not present

## 2018-12-21 DIAGNOSIS — E7800 Pure hypercholesterolemia, unspecified: Secondary | ICD-10-CM | POA: Diagnosis not present

## 2018-12-21 DIAGNOSIS — E875 Hyperkalemia: Secondary | ICD-10-CM | POA: Diagnosis not present

## 2018-12-23 DIAGNOSIS — R5383 Other fatigue: Secondary | ICD-10-CM | POA: Diagnosis not present

## 2018-12-23 DIAGNOSIS — E7800 Pure hypercholesterolemia, unspecified: Secondary | ICD-10-CM | POA: Diagnosis not present

## 2018-12-23 DIAGNOSIS — N183 Chronic kidney disease, stage 3 (moderate): Secondary | ICD-10-CM | POA: Diagnosis not present

## 2018-12-23 DIAGNOSIS — E875 Hyperkalemia: Secondary | ICD-10-CM | POA: Diagnosis not present

## 2018-12-23 DIAGNOSIS — Z87891 Personal history of nicotine dependence: Secondary | ICD-10-CM | POA: Diagnosis not present

## 2018-12-23 DIAGNOSIS — R2681 Unsteadiness on feet: Secondary | ICD-10-CM | POA: Diagnosis not present

## 2018-12-23 DIAGNOSIS — Z7984 Long term (current) use of oral hypoglycemic drugs: Secondary | ICD-10-CM | POA: Diagnosis not present

## 2018-12-23 DIAGNOSIS — I129 Hypertensive chronic kidney disease with stage 1 through stage 4 chronic kidney disease, or unspecified chronic kidney disease: Secondary | ICD-10-CM | POA: Diagnosis not present

## 2018-12-23 DIAGNOSIS — Z9181 History of falling: Secondary | ICD-10-CM | POA: Diagnosis not present

## 2018-12-23 DIAGNOSIS — E1122 Type 2 diabetes mellitus with diabetic chronic kidney disease: Secondary | ICD-10-CM | POA: Diagnosis not present

## 2018-12-28 DIAGNOSIS — R5383 Other fatigue: Secondary | ICD-10-CM | POA: Diagnosis not present

## 2018-12-28 DIAGNOSIS — Z7984 Long term (current) use of oral hypoglycemic drugs: Secondary | ICD-10-CM | POA: Diagnosis not present

## 2018-12-28 DIAGNOSIS — Z87891 Personal history of nicotine dependence: Secondary | ICD-10-CM | POA: Diagnosis not present

## 2018-12-28 DIAGNOSIS — I129 Hypertensive chronic kidney disease with stage 1 through stage 4 chronic kidney disease, or unspecified chronic kidney disease: Secondary | ICD-10-CM | POA: Diagnosis not present

## 2018-12-28 DIAGNOSIS — N183 Chronic kidney disease, stage 3 (moderate): Secondary | ICD-10-CM | POA: Diagnosis not present

## 2018-12-28 DIAGNOSIS — E875 Hyperkalemia: Secondary | ICD-10-CM | POA: Diagnosis not present

## 2018-12-28 DIAGNOSIS — E1122 Type 2 diabetes mellitus with diabetic chronic kidney disease: Secondary | ICD-10-CM | POA: Diagnosis not present

## 2018-12-28 DIAGNOSIS — Z9181 History of falling: Secondary | ICD-10-CM | POA: Diagnosis not present

## 2018-12-28 DIAGNOSIS — R2681 Unsteadiness on feet: Secondary | ICD-10-CM | POA: Diagnosis not present

## 2018-12-28 DIAGNOSIS — E7800 Pure hypercholesterolemia, unspecified: Secondary | ICD-10-CM | POA: Diagnosis not present

## 2018-12-30 DIAGNOSIS — Z7984 Long term (current) use of oral hypoglycemic drugs: Secondary | ICD-10-CM | POA: Diagnosis not present

## 2018-12-30 DIAGNOSIS — E875 Hyperkalemia: Secondary | ICD-10-CM | POA: Diagnosis not present

## 2018-12-30 DIAGNOSIS — Z87891 Personal history of nicotine dependence: Secondary | ICD-10-CM | POA: Diagnosis not present

## 2018-12-30 DIAGNOSIS — Z9181 History of falling: Secondary | ICD-10-CM | POA: Diagnosis not present

## 2018-12-30 DIAGNOSIS — E7800 Pure hypercholesterolemia, unspecified: Secondary | ICD-10-CM | POA: Diagnosis not present

## 2018-12-30 DIAGNOSIS — I129 Hypertensive chronic kidney disease with stage 1 through stage 4 chronic kidney disease, or unspecified chronic kidney disease: Secondary | ICD-10-CM | POA: Diagnosis not present

## 2018-12-30 DIAGNOSIS — N183 Chronic kidney disease, stage 3 (moderate): Secondary | ICD-10-CM | POA: Diagnosis not present

## 2018-12-30 DIAGNOSIS — E1122 Type 2 diabetes mellitus with diabetic chronic kidney disease: Secondary | ICD-10-CM | POA: Diagnosis not present

## 2018-12-30 DIAGNOSIS — R5383 Other fatigue: Secondary | ICD-10-CM | POA: Diagnosis not present

## 2018-12-30 DIAGNOSIS — R2681 Unsteadiness on feet: Secondary | ICD-10-CM | POA: Diagnosis not present

## 2019-01-04 DIAGNOSIS — Z7984 Long term (current) use of oral hypoglycemic drugs: Secondary | ICD-10-CM | POA: Diagnosis not present

## 2019-01-04 DIAGNOSIS — E7800 Pure hypercholesterolemia, unspecified: Secondary | ICD-10-CM | POA: Diagnosis not present

## 2019-01-04 DIAGNOSIS — I129 Hypertensive chronic kidney disease with stage 1 through stage 4 chronic kidney disease, or unspecified chronic kidney disease: Secondary | ICD-10-CM | POA: Diagnosis not present

## 2019-01-04 DIAGNOSIS — N183 Chronic kidney disease, stage 3 (moderate): Secondary | ICD-10-CM | POA: Diagnosis not present

## 2019-01-04 DIAGNOSIS — Z87891 Personal history of nicotine dependence: Secondary | ICD-10-CM | POA: Diagnosis not present

## 2019-01-04 DIAGNOSIS — Z9181 History of falling: Secondary | ICD-10-CM | POA: Diagnosis not present

## 2019-01-04 DIAGNOSIS — R2681 Unsteadiness on feet: Secondary | ICD-10-CM | POA: Diagnosis not present

## 2019-01-04 DIAGNOSIS — E1122 Type 2 diabetes mellitus with diabetic chronic kidney disease: Secondary | ICD-10-CM | POA: Diagnosis not present

## 2019-01-04 DIAGNOSIS — E875 Hyperkalemia: Secondary | ICD-10-CM | POA: Diagnosis not present

## 2019-01-04 DIAGNOSIS — R5383 Other fatigue: Secondary | ICD-10-CM | POA: Diagnosis not present

## 2019-01-06 DIAGNOSIS — Z9181 History of falling: Secondary | ICD-10-CM | POA: Diagnosis not present

## 2019-01-06 DIAGNOSIS — E875 Hyperkalemia: Secondary | ICD-10-CM | POA: Diagnosis not present

## 2019-01-06 DIAGNOSIS — Z7984 Long term (current) use of oral hypoglycemic drugs: Secondary | ICD-10-CM | POA: Diagnosis not present

## 2019-01-06 DIAGNOSIS — E1122 Type 2 diabetes mellitus with diabetic chronic kidney disease: Secondary | ICD-10-CM | POA: Diagnosis not present

## 2019-01-06 DIAGNOSIS — R5383 Other fatigue: Secondary | ICD-10-CM | POA: Diagnosis not present

## 2019-01-06 DIAGNOSIS — N183 Chronic kidney disease, stage 3 (moderate): Secondary | ICD-10-CM | POA: Diagnosis not present

## 2019-01-06 DIAGNOSIS — I129 Hypertensive chronic kidney disease with stage 1 through stage 4 chronic kidney disease, or unspecified chronic kidney disease: Secondary | ICD-10-CM | POA: Diagnosis not present

## 2019-01-06 DIAGNOSIS — E7800 Pure hypercholesterolemia, unspecified: Secondary | ICD-10-CM | POA: Diagnosis not present

## 2019-01-06 DIAGNOSIS — R2681 Unsteadiness on feet: Secondary | ICD-10-CM | POA: Diagnosis not present

## 2019-01-06 DIAGNOSIS — Z87891 Personal history of nicotine dependence: Secondary | ICD-10-CM | POA: Diagnosis not present

## 2019-01-11 ENCOUNTER — Ambulatory Visit: Payer: Medicare Other | Admitting: Diagnostic Neuroimaging

## 2019-01-11 ENCOUNTER — Encounter: Payer: Self-pay | Admitting: *Deleted

## 2019-01-11 ENCOUNTER — Other Ambulatory Visit: Payer: Self-pay

## 2019-01-11 VITALS — BP 169/53 | HR 40 | Temp 98.0°F | Ht 65.0 in | Wt 131.0 lb

## 2019-01-11 DIAGNOSIS — Z7984 Long term (current) use of oral hypoglycemic drugs: Secondary | ICD-10-CM | POA: Diagnosis not present

## 2019-01-11 DIAGNOSIS — Z9181 History of falling: Secondary | ICD-10-CM | POA: Diagnosis not present

## 2019-01-11 DIAGNOSIS — E875 Hyperkalemia: Secondary | ICD-10-CM | POA: Diagnosis not present

## 2019-01-11 DIAGNOSIS — R5383 Other fatigue: Secondary | ICD-10-CM | POA: Diagnosis not present

## 2019-01-11 DIAGNOSIS — N183 Chronic kidney disease, stage 3 (moderate): Secondary | ICD-10-CM | POA: Diagnosis not present

## 2019-01-11 DIAGNOSIS — F03B18 Unspecified dementia, moderate, with other behavioral disturbance: Secondary | ICD-10-CM

## 2019-01-11 DIAGNOSIS — E1122 Type 2 diabetes mellitus with diabetic chronic kidney disease: Secondary | ICD-10-CM | POA: Diagnosis not present

## 2019-01-11 DIAGNOSIS — E7800 Pure hypercholesterolemia, unspecified: Secondary | ICD-10-CM | POA: Diagnosis not present

## 2019-01-11 DIAGNOSIS — R2681 Unsteadiness on feet: Secondary | ICD-10-CM | POA: Diagnosis not present

## 2019-01-11 DIAGNOSIS — F0391 Unspecified dementia with behavioral disturbance: Secondary | ICD-10-CM

## 2019-01-11 DIAGNOSIS — Z87891 Personal history of nicotine dependence: Secondary | ICD-10-CM | POA: Diagnosis not present

## 2019-01-11 DIAGNOSIS — I129 Hypertensive chronic kidney disease with stage 1 through stage 4 chronic kidney disease, or unspecified chronic kidney disease: Secondary | ICD-10-CM | POA: Diagnosis not present

## 2019-01-11 NOTE — Progress Notes (Signed)
[facility]  PATIENT: Alexander Powell DOB: October 29, 1920  REFERRING CLINICIAN: Renne Powell, W HISTORY FROM: patient, wife, daughter  REASON FOR VISIT: new consult    HISTORICAL  CHIEF COMPLAINT:  Chief Complaint  Patient presents with  . Dementia    rm 7, wife- Alexander Powell, dgtr- Alexander Powell, [place]  MMSE 10    HISTORY OF PRESENT ILLNESS:   85 year old male here for evaluation of dementia.  History of hypertension, hyperlipidemia, hearing loss, diabetes.  Patient presented to hospital in May 2024 confusion, weight loss, loss of energy, bradycardia.  He was diagnosed with metabolic encephalopathy, dehydration.  In the hospital possibility of underlying dementia was raised.  Therefore follow-up appointment with neurology was recommended.  In retrospect patient has had at least 3 to 4 years of progressive short-term memory loss, confusion, decline in ADLs.  Now patient is losing objects at home, talking about deceased family members, getting confused about his home and life situation.  Few months ago patient tried to leave his home in the middle of the night.  Gait and balance have declined.  ADLs have declined.  He is not able to function outside of home independently or with assistance.  Needs help with personal hygiene feeding, bathing, and most issues.   REVIEW OF SYSTEMS: Full 14 system review of systems performed and negative with exception of: as per HPI.  ALLERGIES: No Known Allergies  HOME MEDICATIONS: Outpatient Medications Prior to Visit  Medication Sig Dispense Refill  . aspirin 81 MG tablet Take 81 mg by mouth every evening.     . feeding supplement, GLUCERNA SHAKE, (GLUCERNA SHAKE) LIQD Take 237 mLs by mouth 3 (three) times daily between meals. 90 Can 0  . glipiZIDE (GLUCOTROL XL) 2.5 MG 24 hr tablet Take 1 tablet (2.5 mg total) by mouth daily with breakfast. Do not take if SBG < 70    . hydrALAZINE (APRESOLINE) 25 MG tablet Take 25 mg by mouth 2 (two) times a day.     . Multiple Vitamin (MULTIVITAMIN WITH MINERALS) TABS tablet Take 1 tablet by mouth daily.    . pravastatin (PRAVACHOL) 80 MG tablet Take 80 mg by mouth daily after lunch.     . metFORMIN (GLUCOPHAGE) 500 MG tablet Take 500 mg by mouth every morning.     No facility-administered medications prior to visit.     PAST MEDICAL HISTORY: Past Medical History:  Diagnosis Date  . Dementia (HCC)   . DM (diabetes mellitus), type 2 (HCC)   . Hearing loss    pt wear hearing aid  . History of stress test 05/2006   Showed a mild to moderate perfusion defect in the apical wall with no reversibility. He also had a mild to moderate perfusion defect in the mid inferior wall with no reversibilty, and a mild to moderate perfusion defect in the apicl inferior wall with no reversibility. There is thought thatthis could be potentially due to infarct or scar, but not corroborated with his echo.   Marland Kitchen. Hx of echocardiogram 09/2010   Normal LV size with proximal septal thickening, but normal EF at greater than 55% with mild impaired relaxation. Mildly sclerotic, but not stenotic, aortic valve with aortic sclerosis noted, but no other significant lesions or abnormalities, just some left atrial calcification.  . Hyperlipidemia   . Hypertension     PAST SURGICAL HISTORY: Past Surgical History:  Procedure Laterality Date  . BACK SURGERY  1995  . CHOLECYSTECTOMY N/A 04/14/2015   Procedure: LAPAROSCOPIC CHOLECYSTECTOMY;  Surgeon:  Alexander Seltzer, Alexander Powell;  Location: [facility];  Service: General;  Laterality: N/A;    FAMILY HISTORY: No family history on file.  SOCIAL HISTORY: Social History   Socioeconomic History  . Marital status: Married    Spouse name: Not on file  . Number of children: Not on file  . Years of education: Not on file  . Highest education level: Not on file  Occupational History    Comment: retired Banker- [employer]  Social Needs  . Financial resource strain: Not on file  .  Food insecurity    Worry: Not on file    Inability: Not on file  . Transportation needs    Medical: Not on file    Non-medical: Not on file  Tobacco Use  . Smoking status: Former Smoker    Types: Cigarettes    Quit date: 07/25/1988    Years since quitting: 30.4  . Smokeless tobacco: Never Used  Substance and Sexual Activity  . Alcohol use: Yes    Alcohol/week: 0.0 standard drinks    Comment: occassionally  . Drug use: No  . Sexual activity: Never  Lifestyle  . Physical activity    Days per week: Not on file    Minutes per session: Not on file  . Stress: Not on file  Relationships  . Social Herbalist on phone: Not on file    Gets together: Not on file    Attends religious service: Not on file    Active member of club or organization: Not on file    Attends meetings of clubs or organizations: Not on file    Relationship status: Not on file  . Intimate partner violence    Fear of current or ex partner: Not on file    Emotionally abused: Not on file    Physically abused: Not on file    Forced sexual activity: Not on file  Other Topics Concern  . Not on file  Social History Narrative  . Not on file     PHYSICAL EXAM  GENERAL EXAM/CONSTITUTIONAL: Vitals:  Vitals:   01/11/19 1556  BP: (!) 169/53  Pulse: (!) 40  Temp: 98 F (36.7 C)  Weight: 131 lb (59.4 kg)  Height: 5\' 5"  (1.651 m)     Body mass index is 21.8 kg/m. Wt Readings from Last 3 Encounters:  01/11/19 131 lb (59.4 kg)  12/04/18 127 lb 10.3 oz (57.9 kg)  05/04/15 153 lb 9.6 oz (69.7 kg)     Patient is in no distress; well developed, nourished and groomed; neck is supple  CARDIOVASCULAR:  Examination of carotid arteries is normal; no carotid bruits  Regular rate and rhythm, no murmurs  Examination of peripheral vascular system by observation and palpation is normal  EYES:  Ophthalmoscopic exam of optic discs and posterior segments is normal; no papilledema or hemorrhages  No  exam data present  MUSCULOSKELETAL:  Gait, strength, tone, movements noted in Neurologic exam below  NEUROLOGIC: MENTAL STATUS:  MMSE - [place] Exam 01/11/2019  Not completed: (No Data)  Orientation to time 0  Orientation to Place 2  Registration 2  Attention/ Calculation 1  Recall 0  Language- name 2 objects 2  Language- repeat 0  Language- follow 3 step command 2  Language- read & follow direction 1  Write a sentence 0  Copy design 0  Total score 10    awake, alert, oriented to person  [place] attention and  concentration  DECR FLUENCY; COMP INTACT  DECR fund of knowledge  CRANIAL NERVE:   2nd - no papilledema on fundoscopic exam  2nd, 3rd, 4th, 6th - pupils equal and reactive to light, visual fields full to confrontation, extraocular muscles intact, no nystagmus  5th - facial sensation symmetric  7th - facial strength symmetric  8th - hearing intact  9th - palate elevates symmetrically, uvula midline  11th - shoulder shrug symmetric  12th - tongue protrusion midline  FLAT AFFECT; DECR FACIAL EXPRESSIONS  MOTOR:   BRADYKINESIA; COGWHEELING RIGIDITY  MICROGRAPHIA  normal bulk and tone, full strength in the BUE, BLE  SENSORY:   normal and symmetric to light touch  COORDINATION:   fine finger movements SLOW  REFLEXES:   deep tendon reflexes TRACE and symmetric  GAIT/STATION:   DIFF TO STAND; SLOW SHORT STEPS; UNSTEADY; USING WALKER    DIAGNOSTIC DATA (LABS, IMAGING, TESTING) - I reviewed patient records, labs, notes, testing and imaging myself where available.  Lab Results  Component Value Date   WBC 6.5 12/04/2018   HGB 11.4 (L) 12/04/2018   HCT 36.8 (L) 12/04/2018   MCV 91.5 12/04/2018   PLT 242 12/04/2018      Component Value Date/Time   NA 143 12/05/2018 0607   K 4.0 12/05/2018 0607   CL 104 12/05/2018 0607   CO2 24 12/05/2018 0607   GLUCOSE 99 12/05/2018 0607   BUN 17 12/05/2018 0607   CREATININE  1.19 12/05/2018 0607   CALCIUM 9.3 12/05/2018 0607   PROT 6.9 04/12/2015 2328   ALBUMIN 3.5 04/12/2015 2328   AST 42 (H) 04/12/2015 2328   ALT 28 04/12/2015 2328   ALKPHOS 77 04/12/2015 2328   BILITOT 0.6 04/12/2015 2328   GFRNONAA 52 (L) 12/05/2018 0607   GFRAA 60 (L) 12/05/2018 0607   No results found for: CHOL, HDL, LDLCALC, LDLDIRECT, TRIG, CHOLHDL Lab Results  Component Value Date   HGBA1C 6.1 (H) 12/04/2018   Lab Results  Component Value Date   VITAMINB12 1,162 (H) 12/04/2018   Lab Results  Component Value Date   TSH 0.760 12/04/2018    12/03/18 CT head [I reviewed images myself and agree with interpretation. -VRP]  1. No CT evidence for acute intracranial abnormality. 2. Atrophy and small vessel ischemic changes of the white matter    ASSESSMENT AND PLAN  85 y.o. year old male here with progressive short-term memory loss, confusion, getting lost, losing things, decline in cognitive and physical abilities over the last 3 to 4 years.  Most consistent with neurodegenerative dementia.  Dx:  1. Moderate dementia with behavioral disturbance (HCC)     PLAN:  MODERATE-SEVERE DEMENTIA - safety / supervision issues reviewed - caregiver resources provided - continue palliative approach - no driving; no finances  Return for return to PCP, pending if symptoms worsen or fail to improve.    Suanne MarkerVIKRAM R. , Alexander Powell 01/11/2019, 5:17 PM Certified in Neurology, Neurophysiology and Neuroimaging  [facility] [address], [address] [place], [place] [address] [phone]

## 2019-01-13 DIAGNOSIS — R5383 Other fatigue: Secondary | ICD-10-CM | POA: Diagnosis not present

## 2019-01-13 DIAGNOSIS — Z87891 Personal history of nicotine dependence: Secondary | ICD-10-CM | POA: Diagnosis not present

## 2019-01-13 DIAGNOSIS — E1122 Type 2 diabetes mellitus with diabetic chronic kidney disease: Secondary | ICD-10-CM | POA: Diagnosis not present

## 2019-01-13 DIAGNOSIS — E875 Hyperkalemia: Secondary | ICD-10-CM | POA: Diagnosis not present

## 2019-01-13 DIAGNOSIS — Z9181 History of falling: Secondary | ICD-10-CM | POA: Diagnosis not present

## 2019-01-13 DIAGNOSIS — E7800 Pure hypercholesterolemia, unspecified: Secondary | ICD-10-CM | POA: Diagnosis not present

## 2019-01-13 DIAGNOSIS — Z7984 Long term (current) use of oral hypoglycemic drugs: Secondary | ICD-10-CM | POA: Diagnosis not present

## 2019-01-13 DIAGNOSIS — R2681 Unsteadiness on feet: Secondary | ICD-10-CM | POA: Diagnosis not present

## 2019-01-13 DIAGNOSIS — I129 Hypertensive chronic kidney disease with stage 1 through stage 4 chronic kidney disease, or unspecified chronic kidney disease: Secondary | ICD-10-CM | POA: Diagnosis not present

## 2019-01-13 DIAGNOSIS — N183 Chronic kidney disease, stage 3 (moderate): Secondary | ICD-10-CM | POA: Diagnosis not present

## 2019-02-02 DIAGNOSIS — Z79899 Other long term (current) drug therapy: Secondary | ICD-10-CM | POA: Diagnosis not present

## 2019-02-02 DIAGNOSIS — E785 Hyperlipidemia, unspecified: Secondary | ICD-10-CM | POA: Diagnosis not present

## 2019-02-02 DIAGNOSIS — D649 Anemia, unspecified: Secondary | ICD-10-CM | POA: Diagnosis not present

## 2019-02-02 DIAGNOSIS — I10 Essential (primary) hypertension: Secondary | ICD-10-CM | POA: Diagnosis not present

## 2019-02-02 DIAGNOSIS — D638 Anemia in other chronic diseases classified elsewhere: Secondary | ICD-10-CM | POA: Diagnosis not present

## 2019-02-02 DIAGNOSIS — R739 Hyperglycemia, unspecified: Secondary | ICD-10-CM | POA: Diagnosis not present

## 2019-02-09 DIAGNOSIS — Z23 Encounter for immunization: Secondary | ICD-10-CM | POA: Diagnosis not present

## 2019-02-09 DIAGNOSIS — E119 Type 2 diabetes mellitus without complications: Secondary | ICD-10-CM | POA: Diagnosis not present

## 2019-02-09 DIAGNOSIS — I4892 Unspecified atrial flutter: Secondary | ICD-10-CM | POA: Diagnosis not present

## 2019-02-09 DIAGNOSIS — E785 Hyperlipidemia, unspecified: Secondary | ICD-10-CM | POA: Diagnosis not present

## 2019-02-09 DIAGNOSIS — I10 Essential (primary) hypertension: Secondary | ICD-10-CM | POA: Diagnosis not present

## 2019-03-25 DIAGNOSIS — Z23 Encounter for immunization: Secondary | ICD-10-CM | POA: Diagnosis not present

## 2019-05-05 DIAGNOSIS — E785 Hyperlipidemia, unspecified: Secondary | ICD-10-CM | POA: Diagnosis not present

## 2019-05-05 DIAGNOSIS — E119 Type 2 diabetes mellitus without complications: Secondary | ICD-10-CM | POA: Diagnosis not present

## 2019-05-05 DIAGNOSIS — I10 Essential (primary) hypertension: Secondary | ICD-10-CM | POA: Diagnosis not present

## 2019-05-05 DIAGNOSIS — I4892 Unspecified atrial flutter: Secondary | ICD-10-CM | POA: Diagnosis not present

## 2019-05-16 DIAGNOSIS — I10 Essential (primary) hypertension: Secondary | ICD-10-CM | POA: Diagnosis not present

## 2019-05-16 DIAGNOSIS — E785 Hyperlipidemia, unspecified: Secondary | ICD-10-CM | POA: Diagnosis not present

## 2019-05-16 DIAGNOSIS — E119 Type 2 diabetes mellitus without complications: Secondary | ICD-10-CM | POA: Diagnosis not present

## 2019-05-16 DIAGNOSIS — R001 Bradycardia, unspecified: Secondary | ICD-10-CM | POA: Diagnosis not present

## 2019-09-06 DIAGNOSIS — E119 Type 2 diabetes mellitus without complications: Secondary | ICD-10-CM | POA: Diagnosis not present

## 2019-09-06 DIAGNOSIS — E118 Type 2 diabetes mellitus with unspecified complications: Secondary | ICD-10-CM | POA: Diagnosis not present

## 2019-09-06 DIAGNOSIS — I10 Essential (primary) hypertension: Secondary | ICD-10-CM | POA: Diagnosis not present

## 2019-09-12 IMAGING — DX PORTABLE CHEST - 1 VIEW
1 series · 1 of 1 positions shown · non-contrast
Comparison: 04/13/2015

CLINICAL DATA: Altered mental status

EXAM:
PORTABLE CHEST 1 VIEW

[chest ap]
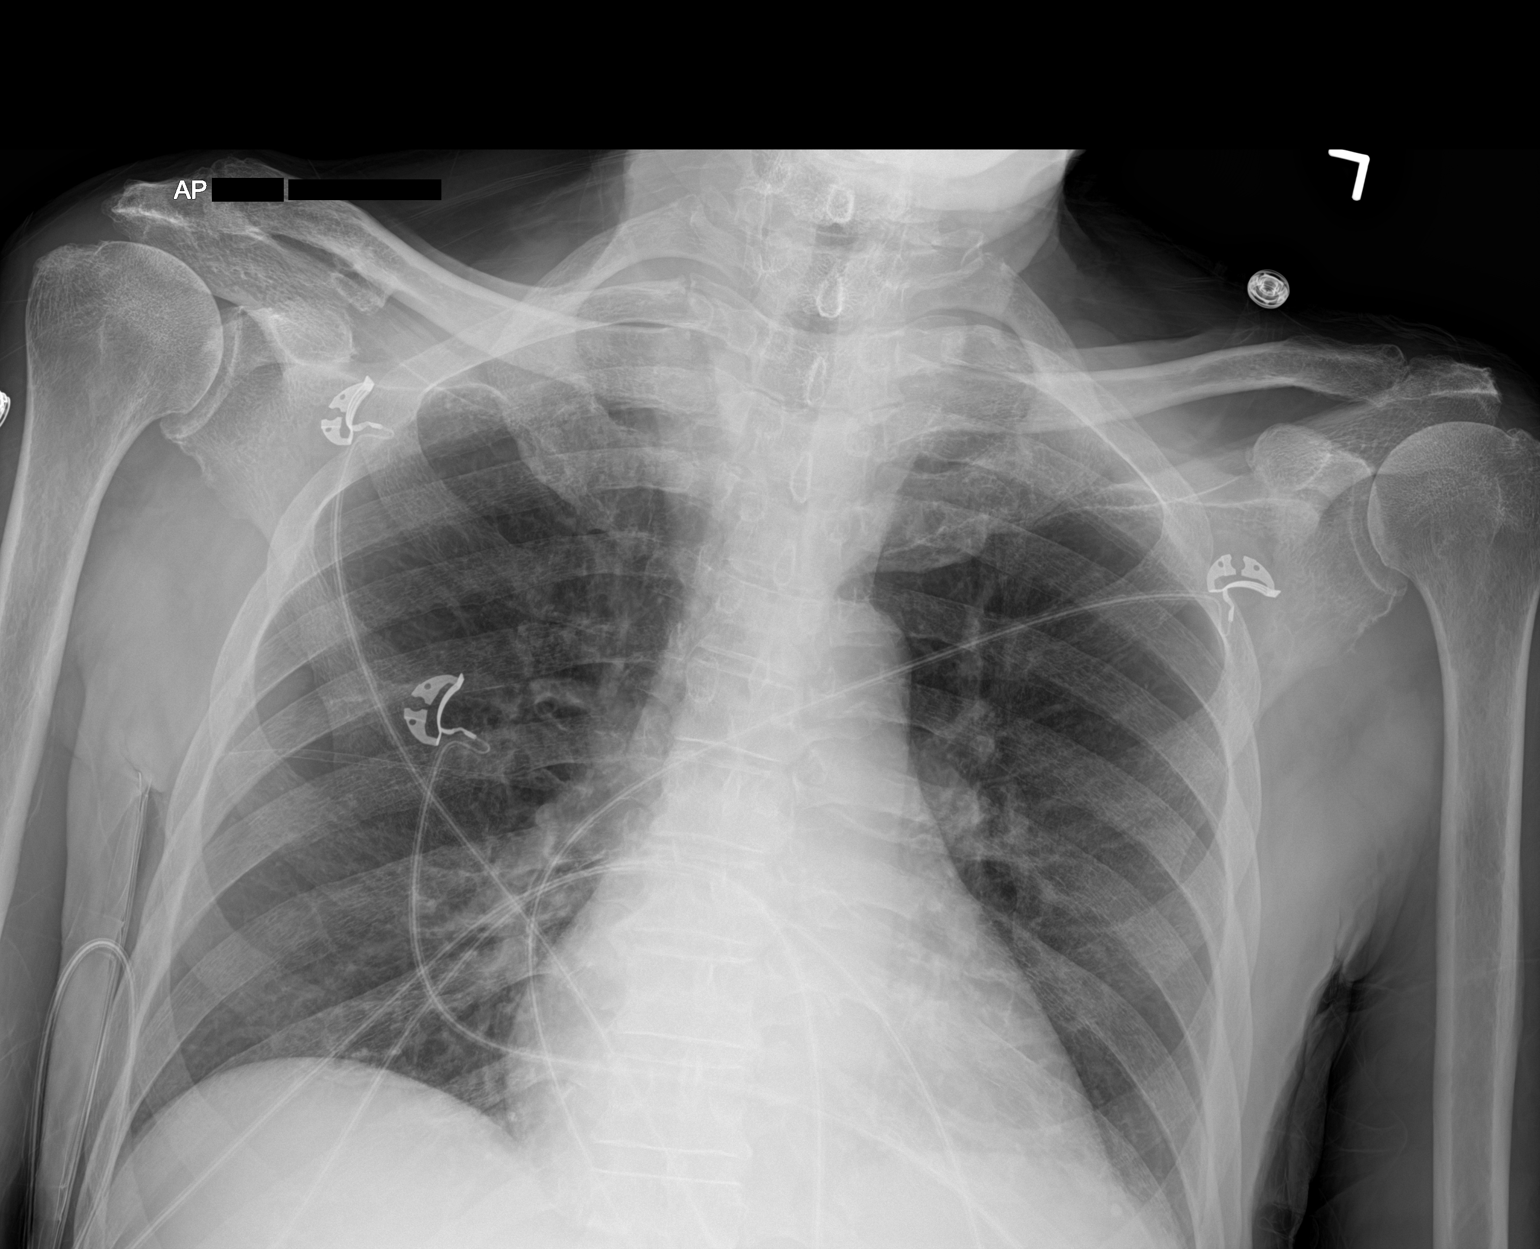

[1 of 1 positions shown; findings below may reference images not displayed]

FINDINGS: Incomplete inclusion of the left base. Right lung is clear. Hazy
opacity left base may be due to mild atelectasis. Heart size within
normal limits. No pneumothorax
IMPRESSION: Incomplete inclusion of left lung base. Possible hazy atelectasis at
the left base

## 2019-09-12 IMAGING — CT CT HEAD WITHOUT CONTRAST
4 series · 16 of 47 positions shown, 18 images · non-contrast
Comparison: None.

CLINICAL DATA: Altered LOC

EXAM:
CT HEAD WITHOUT CONTRAST
TECHNIQUE: Contiguous axial images were obtained from the base of the skull
through the vertex without intravenous contrast.

[Series 3: head without · axial · non-contrast · 0.45mm/px · z∈[+1190,+1310]mm · 7 of 32 slices shown, 9 images]
[im 4/32  brain]
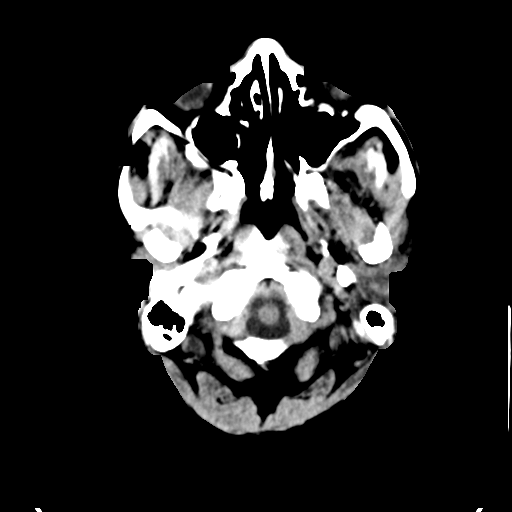
[im 4/32  bone]
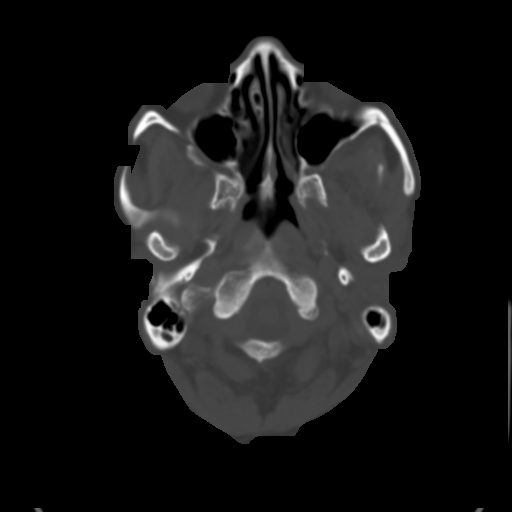
[im 8/32  brain]
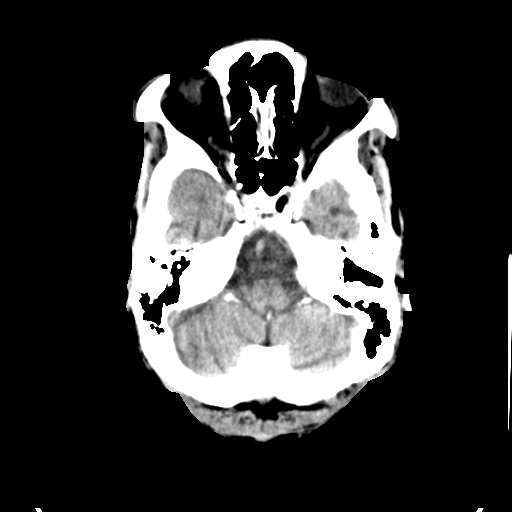
[im 12/32  brain]
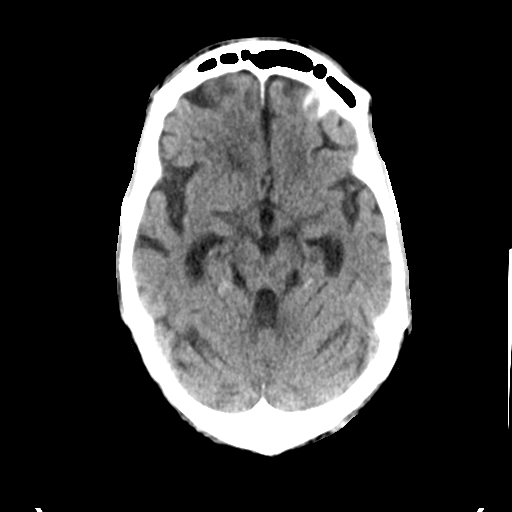
[im 16/32  brain]
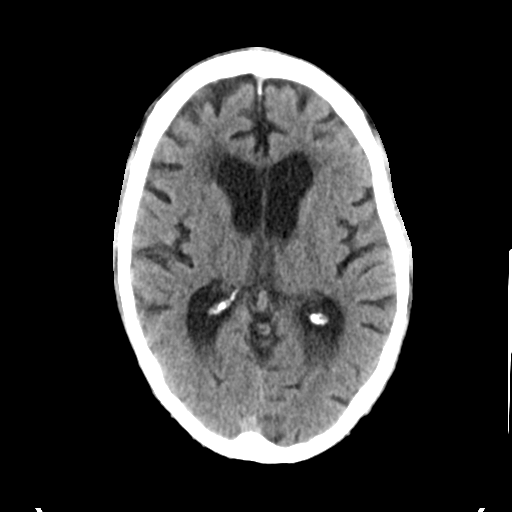
[im 20/32  brain]
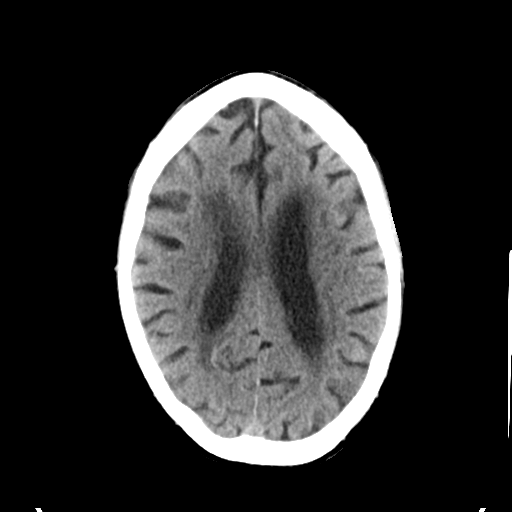
[im 20/32  bone]
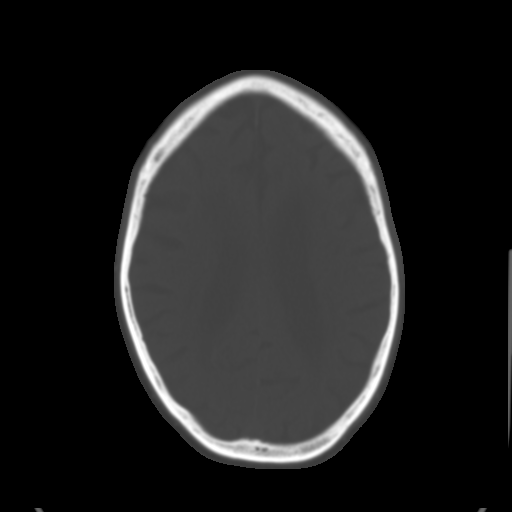
[im 24/32  brain]
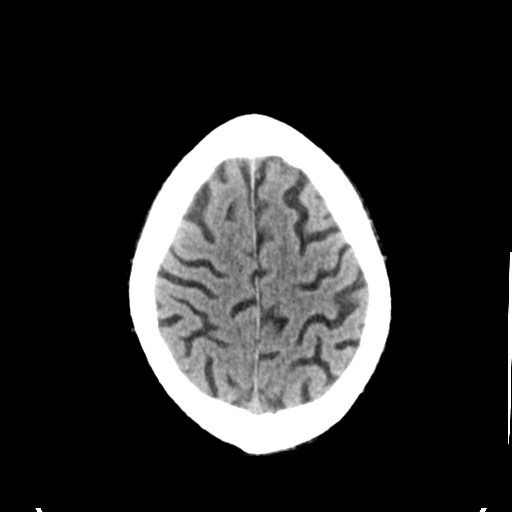
[im 28/32  brain]
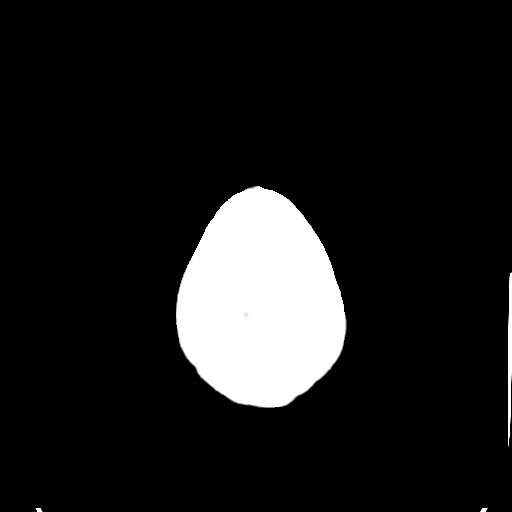

[Series 4: head bone · axial · 0.45mm/px · z∈[+1189,+1221]mm · 3 of 80 slices shown]
[im 8/80  bone]
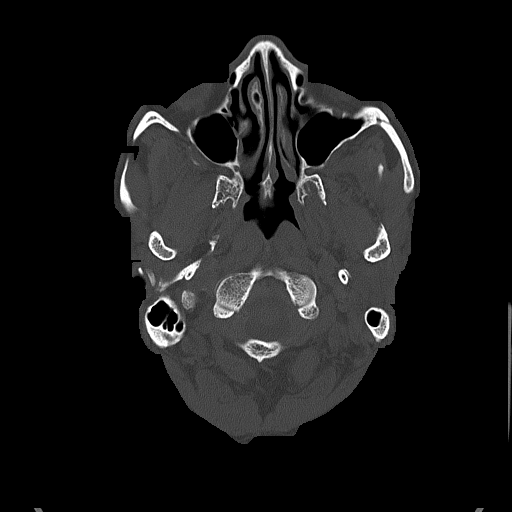
[im 16/80  bone]
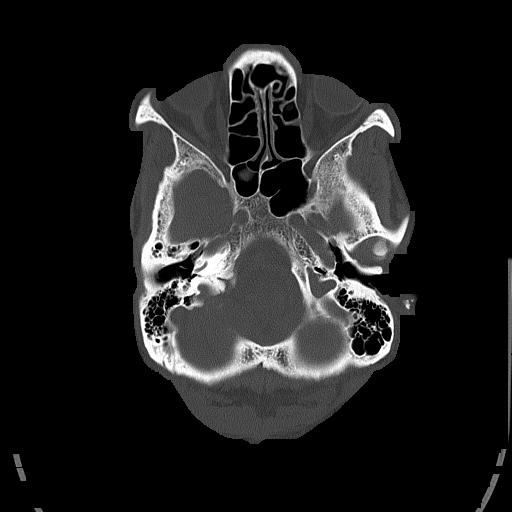
[im 24/80  bone]
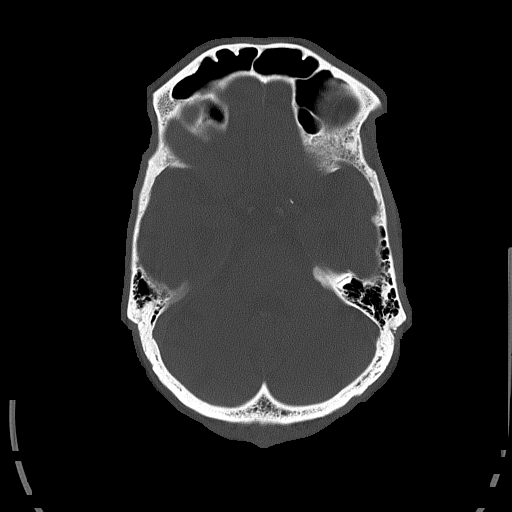

[Series 5: head without cor · coronal · non-contrast · 0.30mm/px · 3 of 70 slices shown]
[im 24/70  brain]
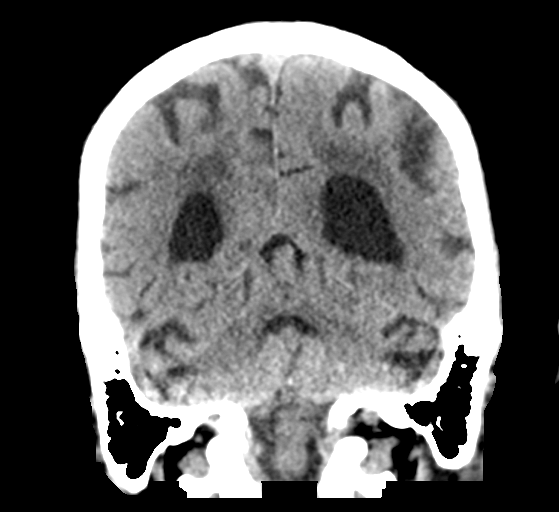
[im 31/70  brain]
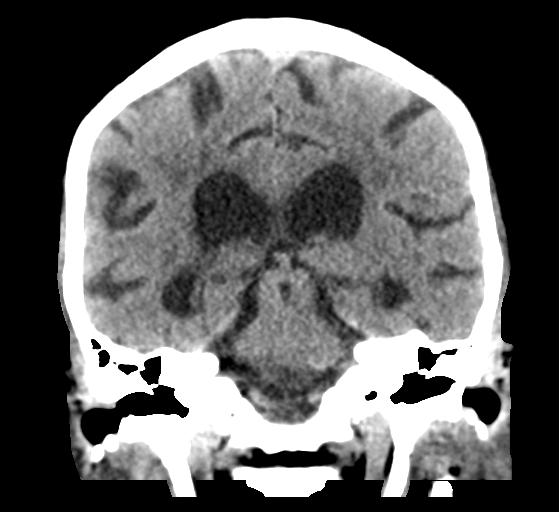
[im 39/70  brain]
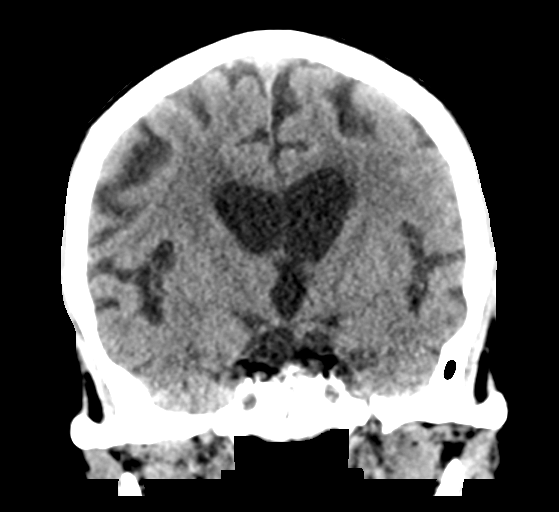

[Series 6: head without sag · sagittal · non-contrast · 0.30mm/px · 3 of 57 slices shown]
[im 19/57  brain]
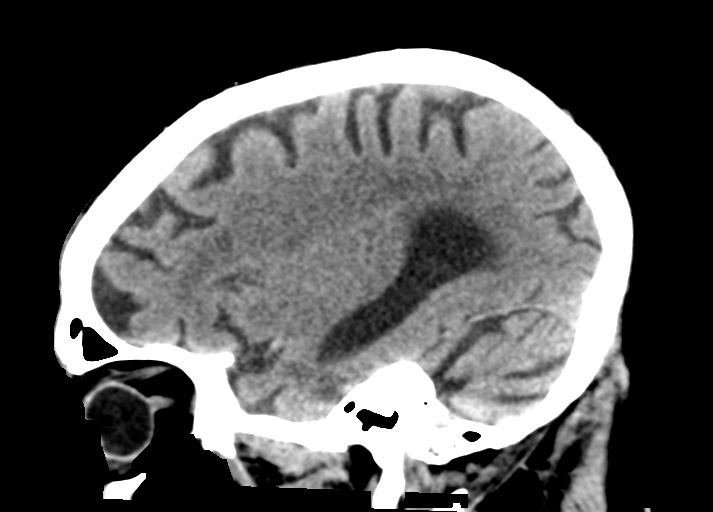
[im 29/57  brain]
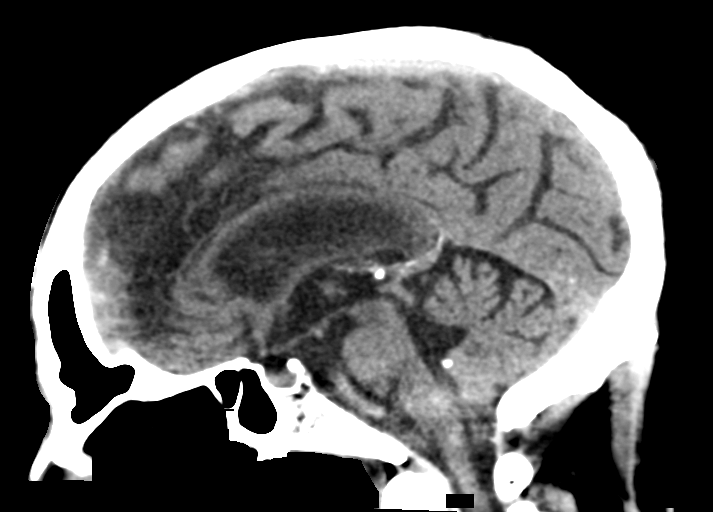
[im 38/57  brain]
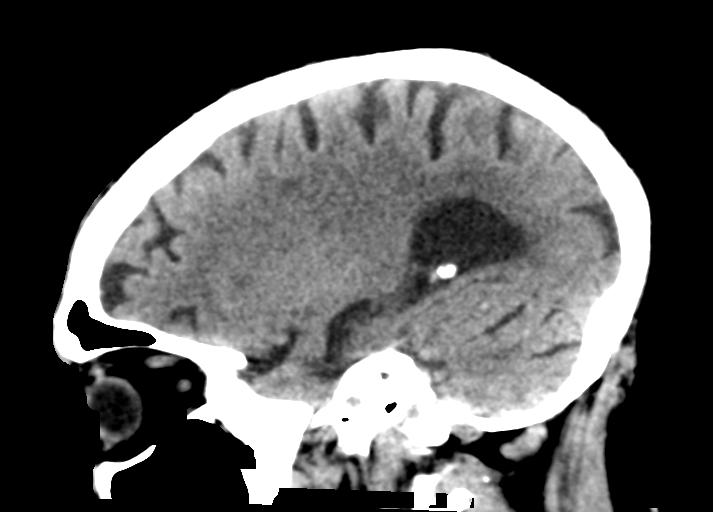

[16 of 47 positions shown; findings below may reference images not displayed]

FINDINGS: Brain: No acute territorial infarction, hemorrhage, or intracranial
mass. Moderate atrophy. Moderate hypodensity within the
periventricular white matter, likely small vessel ischemic disease.
Mildly prominent ventricles, felt secondary to atrophy. Mild
cerebellar atrophy.

Vascular: No hyperdense vessels.  Carotid vascular calcification.

Skull: Normal. Negative for fracture or focal lesion.

Sinuses/Orbits: Mucosal thickening within the ethmoid sinuses.
Retention cyst in the right sphenoid sinus.

Other: None
IMPRESSION: 1. No CT evidence for acute intracranial abnormality.
2. Atrophy and small vessel ischemic changes of the white matter

## 2019-09-13 DIAGNOSIS — E785 Hyperlipidemia, unspecified: Secondary | ICD-10-CM | POA: Diagnosis not present

## 2019-09-13 DIAGNOSIS — I10 Essential (primary) hypertension: Secondary | ICD-10-CM | POA: Diagnosis not present

## 2019-09-13 DIAGNOSIS — R748 Abnormal levels of other serum enzymes: Secondary | ICD-10-CM | POA: Diagnosis not present

## 2019-09-13 DIAGNOSIS — E119 Type 2 diabetes mellitus without complications: Secondary | ICD-10-CM | POA: Diagnosis not present

## 2019-11-10 DIAGNOSIS — Z0000 Encounter for general adult medical examination without abnormal findings: Secondary | ICD-10-CM | POA: Diagnosis not present

## 2019-11-10 DIAGNOSIS — I10 Essential (primary) hypertension: Secondary | ICD-10-CM | POA: Diagnosis not present

## 2019-11-10 DIAGNOSIS — I4892 Unspecified atrial flutter: Secondary | ICD-10-CM | POA: Diagnosis not present

## 2019-11-10 DIAGNOSIS — E119 Type 2 diabetes mellitus without complications: Secondary | ICD-10-CM | POA: Diagnosis not present

## 2020-03-08 DIAGNOSIS — E119 Type 2 diabetes mellitus without complications: Secondary | ICD-10-CM | POA: Diagnosis not present

## 2020-03-08 DIAGNOSIS — Z0000 Encounter for general adult medical examination without abnormal findings: Secondary | ICD-10-CM | POA: Diagnosis not present

## 2020-03-08 DIAGNOSIS — E7800 Pure hypercholesterolemia, unspecified: Secondary | ICD-10-CM | POA: Diagnosis not present

## 2020-03-08 DIAGNOSIS — I10 Essential (primary) hypertension: Secondary | ICD-10-CM | POA: Diagnosis not present

## 2020-03-15 DIAGNOSIS — I10 Essential (primary) hypertension: Secondary | ICD-10-CM | POA: Diagnosis not present

## 2020-03-15 DIAGNOSIS — I4892 Unspecified atrial flutter: Secondary | ICD-10-CM | POA: Diagnosis not present

## 2020-03-15 DIAGNOSIS — E119 Type 2 diabetes mellitus without complications: Secondary | ICD-10-CM | POA: Diagnosis not present

## 2020-03-15 DIAGNOSIS — E875 Hyperkalemia: Secondary | ICD-10-CM | POA: Diagnosis not present

## 2020-07-10 DIAGNOSIS — I10 Essential (primary) hypertension: Secondary | ICD-10-CM | POA: Diagnosis not present

## 2020-07-10 DIAGNOSIS — R946 Abnormal results of thyroid function studies: Secondary | ICD-10-CM | POA: Diagnosis not present

## 2020-07-10 DIAGNOSIS — E119 Type 2 diabetes mellitus without complications: Secondary | ICD-10-CM | POA: Diagnosis not present

## 2020-07-10 DIAGNOSIS — E785 Hyperlipidemia, unspecified: Secondary | ICD-10-CM | POA: Diagnosis not present

## 2020-07-17 DIAGNOSIS — H6120 Impacted cerumen, unspecified ear: Secondary | ICD-10-CM | POA: Diagnosis not present

## 2020-07-17 DIAGNOSIS — I10 Essential (primary) hypertension: Secondary | ICD-10-CM | POA: Diagnosis not present

## 2020-07-17 DIAGNOSIS — E119 Type 2 diabetes mellitus without complications: Secondary | ICD-10-CM | POA: Diagnosis not present

## 2021-05-07 DEATH — deceased
